# Patient Record
Sex: Male | Born: 1970 | Race: Black or African American | Hispanic: No | Marital: Single | Smoking: Current every day smoker
Health system: Southern US, Community
[De-identification: ages and names within clinical notes are randomized; demographics above are authoritative.]

## PROBLEM LIST (undated history)

## (undated) DIAGNOSIS — J45909 Unspecified asthma, uncomplicated: Secondary | ICD-10-CM

## (undated) HISTORY — PX: PARTIAL HIP ARTHROPLASTY: SHX733

---

## 2004-02-17 ENCOUNTER — Inpatient Hospital Stay: Payer: Self-pay | Admitting: Anesthesiology

## 2004-12-11 ENCOUNTER — Emergency Department: Payer: Self-pay | Admitting: Emergency Medicine

## 2005-02-11 ENCOUNTER — Emergency Department: Payer: Self-pay | Admitting: Emergency Medicine

## 2005-02-12 ENCOUNTER — Emergency Department: Payer: Self-pay | Admitting: Emergency Medicine

## 2005-02-23 ENCOUNTER — Inpatient Hospital Stay: Payer: Self-pay | Admitting: Internal Medicine

## 2005-02-23 ENCOUNTER — Other Ambulatory Visit: Payer: Self-pay

## 2006-05-21 ENCOUNTER — Emergency Department: Payer: Self-pay | Admitting: Emergency Medicine

## 2006-07-16 ENCOUNTER — Emergency Department: Payer: Self-pay | Admitting: Emergency Medicine

## 2007-03-22 ENCOUNTER — Emergency Department: Payer: Self-pay | Admitting: Emergency Medicine

## 2007-05-23 ENCOUNTER — Emergency Department: Payer: Self-pay | Admitting: Emergency Medicine

## 2007-06-03 ENCOUNTER — Emergency Department: Payer: Self-pay | Admitting: Emergency Medicine

## 2007-09-26 ENCOUNTER — Ambulatory Visit: Payer: Self-pay | Admitting: General Practice

## 2007-09-26 ENCOUNTER — Other Ambulatory Visit: Payer: Self-pay

## 2007-10-11 ENCOUNTER — Ambulatory Visit: Payer: Self-pay | Admitting: Family Medicine

## 2007-10-14 ENCOUNTER — Inpatient Hospital Stay: Payer: Self-pay | Admitting: General Practice

## 2008-11-12 ENCOUNTER — Ambulatory Visit: Payer: Self-pay | Admitting: General Practice

## 2008-11-28 ENCOUNTER — Inpatient Hospital Stay: Payer: Self-pay | Admitting: General Practice

## 2009-01-08 IMAGING — CR RIGHT HIP - COMPLETE 2+ VIEW
1 series · 2 of 2 positions shown · non-contrast
Comparison: none

REASON FOR EXAM: postop
COMMENTS:   Bedside (portable):Y

[Series 1: view not recorded · 0.17mm/px · 2 of 2 slices shown]
[im 1/2]
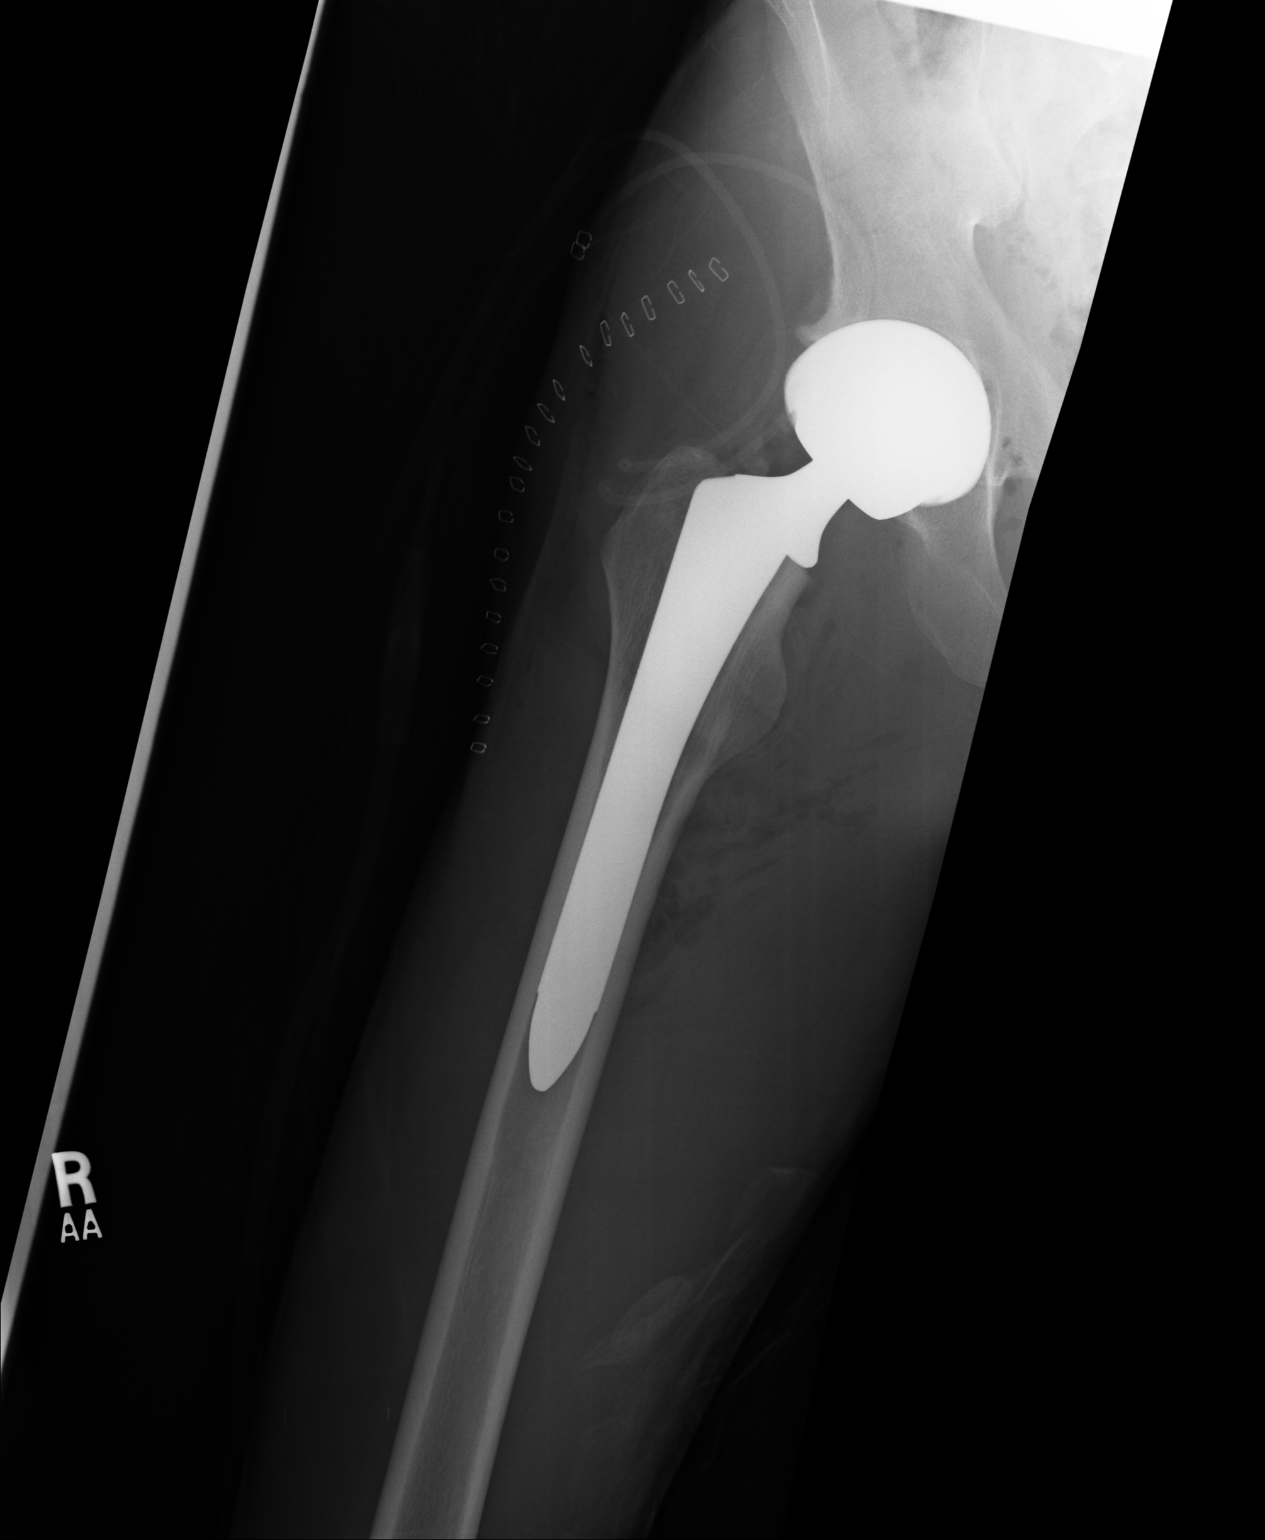
[im 2/2]
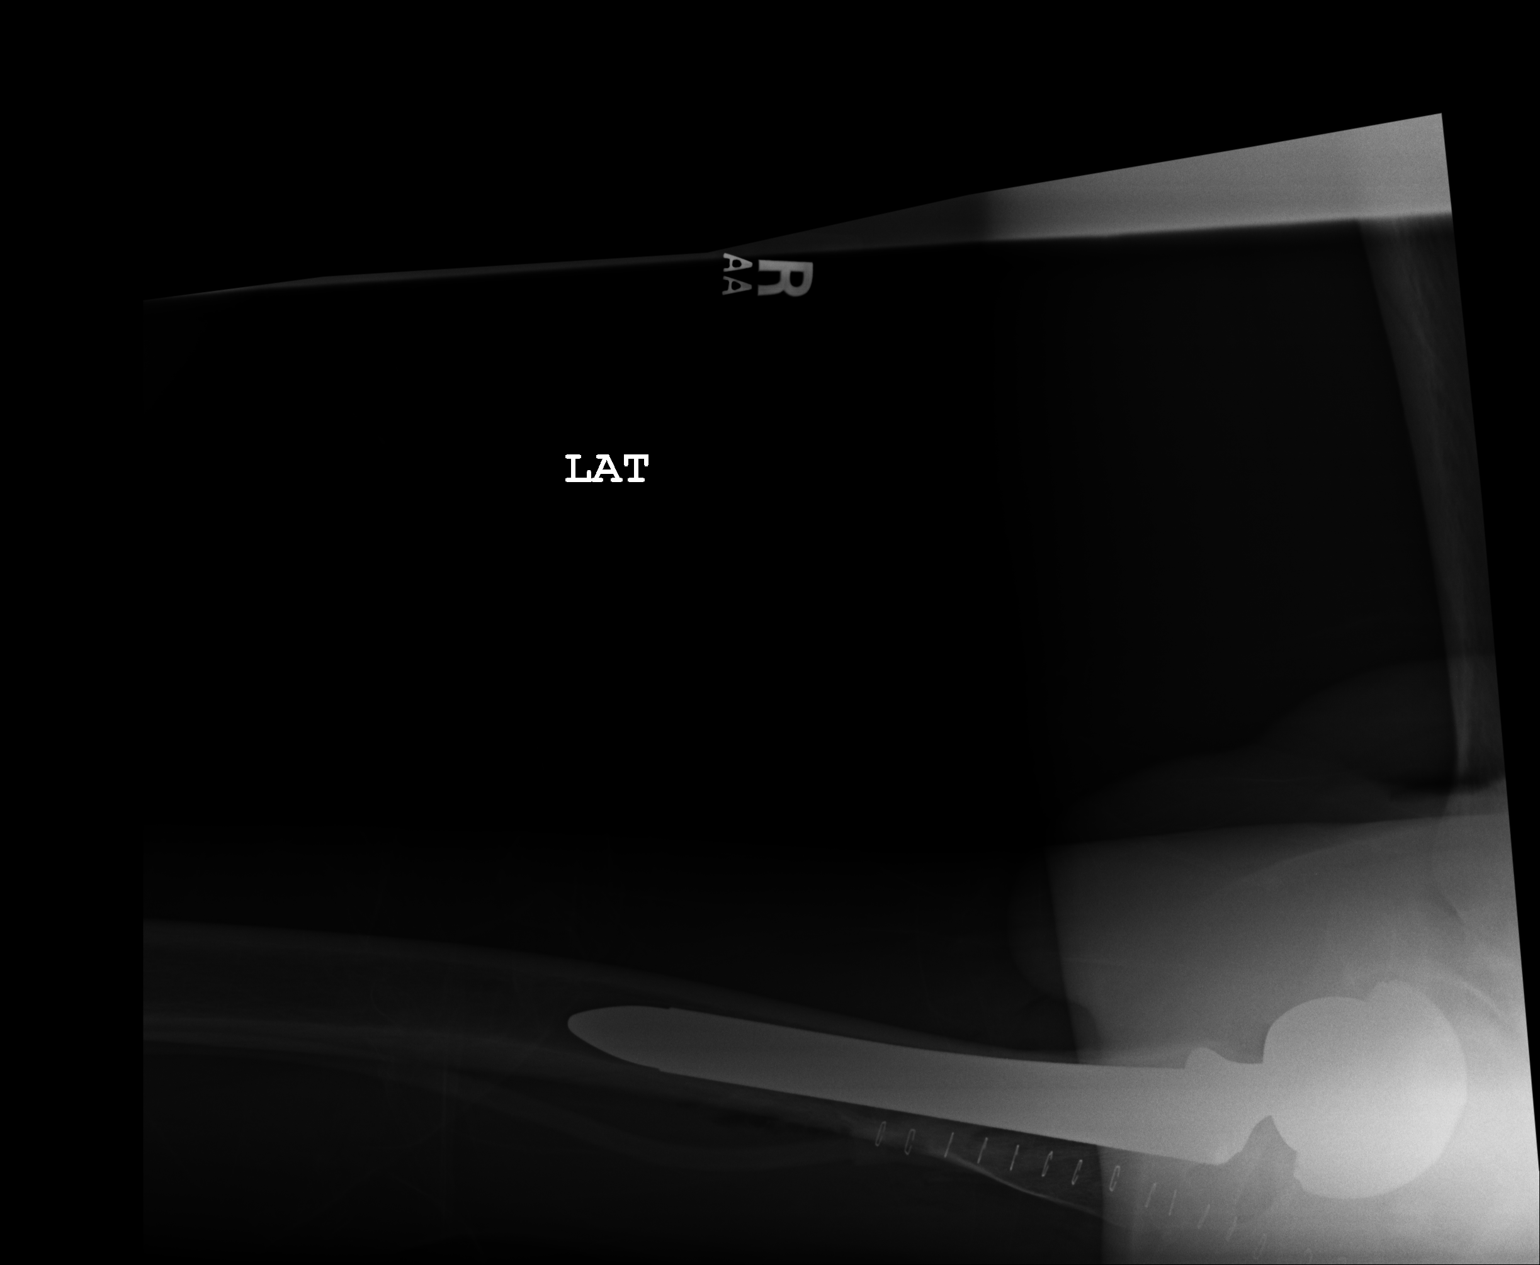

[2 of 2 positions shown; findings below may reference images not displayed]

PROCEDURE:     DXR - DXR HIP RIGHT COMPLETE  - October 14, 2007 [DATE]

RESULT:     Portable AP and crosstable lateral views of the RIGHT hip were
obtained. The patient is status post RIGHT hip replacement. No fracture
about the prosthetic components is seen. There is no dislocation at the
prosthetic hip joint.
IMPRESSION: 1.     The patient is status post RIGHT hip replacement. No abnormal
postoperative changes are identified.

## 2009-01-21 ENCOUNTER — Encounter: Payer: Self-pay | Admitting: General Practice

## 2009-02-18 ENCOUNTER — Encounter: Payer: Self-pay | Admitting: General Practice

## 2009-04-02 ENCOUNTER — Ambulatory Visit: Payer: Self-pay | Admitting: Family Medicine

## 2009-12-19 ENCOUNTER — Emergency Department: Payer: Self-pay | Admitting: Emergency Medicine

## 2013-04-03 ENCOUNTER — Ambulatory Visit: Payer: Self-pay

## 2013-08-25 ENCOUNTER — Inpatient Hospital Stay: Payer: Self-pay | Admitting: Family Medicine

## 2013-08-25 LAB — COMPREHENSIVE METABOLIC PANEL
ALK PHOS: 86 U/L
AST: 21 U/L (ref 15–37)
Albumin: 4.1 g/dL (ref 3.4–5.0)
Anion Gap: 7 (ref 7–16)
BILIRUBIN TOTAL: 0.5 mg/dL (ref 0.2–1.0)
BUN: 14 mg/dL (ref 7–18)
Calcium, Total: 9.3 mg/dL (ref 8.5–10.1)
Chloride: 102 mmol/L (ref 98–107)
Co2: 27 mmol/L (ref 21–32)
Creatinine: 1.07 mg/dL (ref 0.60–1.30)
EGFR (African American): >60
EGFR (Non-African Amer.): >60
Glucose: 97 mg/dL (ref 65–99)
Osmolality: 272 (ref 275–301)
Potassium: 4 mmol/L (ref 3.5–5.1)
SGPT (ALT): 17 U/L (ref 12–78)
Sodium: 136 mmol/L (ref 136–145)
Total Protein: 7.9 g/dL (ref 6.4–8.2)

## 2013-08-25 LAB — CBC
HCT: 44.3 % (ref 40.0–52.0)
HGB: 14.8 g/dL (ref 13.0–18.0)
MCH: 32.4 pg (ref 26.0–34.0)
MCHC: 33.4 g/dL (ref 32.0–36.0)
MCV: 97 fL (ref 80–100)
Platelet: 162 10*3/uL (ref 150–440)
RBC: 4.57 10*6/uL (ref 4.40–5.90)
RDW: 13.6 % (ref 11.5–14.5)
WBC: 9.1 10*3/uL (ref 3.8–10.6)

## 2013-08-25 LAB — TROPONIN I

## 2013-08-26 LAB — BASIC METABOLIC PANEL
Anion Gap: 6 — ABNORMAL LOW (ref 7–16)
BUN: 15 mg/dL (ref 7–18)
CO2: 25 mmol/L (ref 21–32)
Calcium, Total: 9.6 mg/dL (ref 8.5–10.1)
Chloride: 104 mmol/L (ref 98–107)
Creatinine: 1.26 mg/dL (ref 0.60–1.30)
EGFR (Non-African Amer.): >60
Glucose: 128 mg/dL — ABNORMAL HIGH (ref 65–99)
OSMOLALITY: 273 (ref 275–301)
POTASSIUM: 4.4 mmol/L (ref 3.5–5.1)
Sodium: 135 mmol/L — ABNORMAL LOW (ref 136–145)

## 2014-07-20 ENCOUNTER — Ambulatory Visit: Payer: Self-pay

## 2014-08-11 NOTE — H&P (Signed)
PATIENT NAME:  Carlos HeritageURNER, Graden L MR#:  [id number] DATE OF BIRTH:  26-Dec-1970  DATE OF ADMISSION:  08/25/2013  ADDENDUM  This patient told me he does not drink.  He drinks occasional alcohol and does not use any substance abuse.  The nurse just called me and said he drinks two 20 ounce alcoholic beverages a day and does marijuana and cocaine. The patient is on CIWA protocol.   ____________________________ Janyth ContesSital P. Juliene PinaMody, MD spm:ce D: 08/25/2013 19:24:30 ET T: 08/25/2013 20:09:47 ET JOB#: [id number]  cc: Briana Farner P. Juliene PinaMody, MD, <Dictator> Janyth ContesSITAL P Mae Cianci MD ELECTRONICALLY SIGNED 08/25/2013 20:41

## 2014-08-11 NOTE — H&P (Signed)
PATIENT NAME:  Carlos Maddox, Carlos Maddox MR#:  [id number] DATE OF BIRTH:  [birth year]  DATE OF ADMISSION:  08/25/2013  ADDENDUM:  The patient's admission diagnosis should actually be acute respiratory failure secondary to asthma exacerbation and the plan as already outlined.   ____________________________ Norfleet Capers P. Juliene PinaMody, MD spm:lt D: 08/25/2013 17:53:15 ET T: 08/25/2013 19:55:21 ET JOB#: [id number]  cc: Tyasia Packard P. Juliene PinaMody, MD, <Dictator> Janyth ContesSITAL P Shirl Weir MD ELECTRONICALLY SIGNED 08/25/2013 20:41

## 2014-08-11 NOTE — H&P (Signed)
PATIENT NAME:  Carlos Maddox, Carlos Maddox MR#:  [id number] DATE OF BIRTH:  1970-05-01  DATE OF ADMISSION:  08/25/2013  PRIMARY CARE PHYSICIAN:  Darreld Mclean, MD  CHIEF COMPLAINT: Shortness of breath and wheezing.   HISTORY OF PRESENT ILLNESS: A very pleasant 44 year old male with history of asthma and tobacco dependence who presents with the above complaints. The patient says yesterday he is feeling short of breath and had some wheezing. However, today, it was worse. He took his nebulizer. Did not really improve and then he went out to mow his lawn and after that he subsequently developed increasing shortness of breath, wheezing, and was seen in the ER where he received Solu-Medrol, DuoNeb and he remains hypoxic needing oxygen. He does not wear oxygen at home.    REVIEW OF SYSTEMS:  CONSTITUTIONAL: No fever. No fatigue, weakness.  EYES: No blurred vision, glaucoma or cataracts.  ENT: No ear pain, hearing loss, seasonal allergies, snoring, postnasal drip.  RESPIRATORY: Positive cough, nonproductive. Positive wheezing. Positive dyspnea. Positive history of asthma.  CARDIOVASCULAR:  No chest pain, orthopnea, palpitations, syncope, edema, arrhythmia, dyspnea on exertion. GASTROINTESTINAL: No nausea, vomiting, diarrhea, abdominal pain, melena or ulcers.  GENITOURINARY: No dysuria or hematuria. ENDOCRINE:  No polyuria, polydipsia. HEMATOLOGIC:  No anemia or easy bruising. SKIN:  No rash or lesions. MUSCULOSKELETAL: He has limited activity. He had bilateral hip replacements for avascular necrosis.  NEUROLOGIC:  No history of CVA, TIA, seizures. PSYCHIATRIC:  No history of anxiety or depression.  PAST MEDICAL HISTORY: 1.  Asthma. 2.  Tobacco dependence. 3.  Avascular necrosis.  PAST SURGICAL HISTORY:  Bilateral hip replacements.  MEDICATIONS: He only uses ProAir and he has a nebulizer at home.   ALLERGIES: PREDNISONE. HE SAYS THIS IS HIS ALLERGY BECAUSE IT CAUSED AVASCULAR NECROSIS.   FAMILY HISTORY:  Positive for hypertension.   SOCIAL HISTORY: The patient smokes 1 pack a day. He is not interested in quitting. He has occasional alcohol use.   PHYSICAL EXAMINATION:  VITAL SIGNS: Temperature 98.6, pulse 95, respirations 16, blood pressure 115/58, 96% on 2 liters.  GENERAL: The patient is alert, oriented. Initially, he was in moderate distress. Currently, he does not seem to be in any distress.  HEENT: Head is atraumatic. Pupils are round and reactive. Sclerae anicteric. Mucous membranes are moist. Oropharynx is clear. NECK: Supple without JVD, carotid bruits. CARDIOVASCULAR: Regular rate and rhythm. No murmurs, gallops or rubs. PMI is not displaced.  LUNGS: Diffuse wheezing throughout his lung fields. He has fair air movement. No rhonchi are heard. No rales or crackles. Normal chest expansion.  ABDOMEN: Bowel sounds are present. Nontender, nondistended. No hepatosplenomegaly. EXTREMITIES: No clubbing, cyanosis or edema. NEUROLOGIC:  Cranial nerves II through XII are grossly intact.  No focal deficits. SKIN: Without rash or lesions.   BACK: No CVA or vertebral tenderness.   LABORATORIES: White blood cells 9, hemoglobin 14.8, hematocrit 45. Platelets are 162. Sodium 136, potassium 4.0, chloride 102, bicarb 27, BUN 14, creatinine 1.07, glucose 97, calcium 9.3, bilirubin 0.5, alkaline phosphatase 86, ALT 17, AST 21, total protein 7.9, albumin 4.1. Troponin less than 0.02.  Chest x-ray shows no acute cardiopulmonary disease.   ASSESSMENT AND PLAN:  A 44 year old male with a history of tobacco dependence, asthma, who presents with asthma exacerbation.  1.  Asthma exacerbation. The patient is hypoxic on room air, satting about 88% to 90% on room air. He is 92% on 2 liters. He has received treatment in the Emergency Room without much relief. He  continues to have bilateral wheezing and oxygen requirement; therefore, he is admitted to medicine surgical services with IV Solu-Medrol, which the patient  does agree to take given his "ALLERGY" TO PREDNISONE. DuoNeb, Advair and oxygen, which will be weaned as tolerated. I will go ahead and also add Levaquin.  2.  Tobacco dependence. I encouraged the patient to stop smoking. At this time, he is not interested in quitting smoking but he is agreeable for a nicotine patch.  The patient is counseled for 3 minutes.  3.  Alcohol abuse. It does not appear that patient will have any withdrawal. We will continue to monitor. I do not think he needs to be on CIWA at this time.   The patient is a FULL CODE STATUS.  TIME SPENT: Approximately 40 minutes.     ____________________________ Janyth ContesSital P. Juliene PinaMody, MD spm:ce D: 08/25/2013 17:51:08 ET T: 08/25/2013 19:45:28 ET JOB#: [id number]  cc: Jeslyn Amsler P. Juliene PinaMody, MD, <Dictator> Leanna SatoLinda M. Miles, MD Janyth ContesSITAL P Sosaia Pittinger MD ELECTRONICALLY SIGNED 08/25/2013 20:41

## 2014-08-11 NOTE — Discharge Summary (Signed)
PATIENT NAME:  Carlos Maddox, Carlos Maddox MR#:  [id number] DATE OF BIRTH:  1971-02-11  DATE OF ADMISSION:  08/25/2013 DATE OF DISCHARGE:  08/28/2013  REASON FOR ADMISSION:  Shortness of breath and wheezing.   DISCHARGE DIAGNOSES:  1.  Acute asthma exacerbation.  2.  Chronic obstructive pulmonary disease.  3.  Tobacco dependency.  4.  History of avascular necrosis of the hips.  5.  Alcohol abuse.  6.  Acute hypoxic respiratory failure.   DISPOSITION:  Home.   MEDICATIONS AT DISCHARGE:  1.  Albuterol inhale for nebulizer 3 mL every 6 hours as needed for shortness of breath.  2.  Solu-Medrol Dosepak, start with 32 mg 4 mg tablets to take 8 the first day and then decrease by 1 tablet every day until gone.  3.  Levofloxacin 500 mg every 24 hours.  4.  Singular 10 mg once a day.  5.  Albuterol/ipratropium 4 times a day as needed for shortness of breath or wheezing.  6.  Advair 250/50 mcg inhaled twice daily.  7.  Nicotine patches to start on 14 mg once a day for 7 days, then decrease the dose to 7 mg once a day for another 7 days, then to quit smoking.   He is albuterol inhaler was stopped and changed for Combivent.   DISCHARGE DISPOSITION: Home. Follow up with Darreld McleanLinda Miles at [facility] in the next 1 to 2 weeks.   Smoking cessation counseling given to the patient extensively during this hospitalization.   HOSPITAL COURSE: This is nice 44 year old gentleman who has history of asthma, tobacco dependency and avascular necrosis of bilateral hips, comes to the Emergency Department complaining of shortness of breath and wheezing.   The patient was admitted on 08/25/2013. He had, at that moment, a history of several days of feeling short of breath with worsening on the day of admission. The patient started wheezing significantly and was not able to ambulate without getting significantly winded. The patient used his nebulizer several times a day and did not have any significant improvement for what he was  brought to the Emergency Department. In the Emergency Department, he received Solu-Medrol and DuoNebs, and he was found to be hypoxic on room air with oxygen saturation below 90, and on 2 liters of nasal cannula remained around 96. His lowest room air saturation was 88% and over 2 liters remained above 92.   The patient had continued treatment for this asthma exacerbation during this hospitalization with IV Solu-Medrol and he was started on DuoNebs. Apparently, the patient was ALLERGIC TO PREDNISONE, but he tolerated Solu-Medrol very well. We are not quite sure what the allergic reaction was, although seems more like a side effect. Since the patient was not willing to take prednisone, he was discharged on Medrol Dosepak. The patient actually did well during this hospitalization. He states that he is going to try his best to quit smoking, although he feels like the nicotine patches are really extensive, even though making the numbers, it shows that they are less expensive than buying a pack of cigarettes.   As far as his alcohol dependency, he was on CIWA protocol. He did not have any signs of DTs. The patient was able to be removed from supplemental oxygen within the first 24 hours, and had significant improvement during the next couple of days.   The patient continued to wheeze through the whole hospitalization and had some significant rhonchi, although I think this is more of his baseline, because he  was ambulating, feeling fine  with minimal cough.   The patient was educated about the need of continuing his inhalers at home and completed the course of antibiotics and steroids. The patient did not have any infiltrates on his x-ray.  No signs of pneumonia, but it felt like he might benefit from an antibiotic for acute bronchitis.   IMPORTANT IMAGING AND LABORATORY DATA: Portable chest x-ray showed  findings consistent with chronic reactive airway disease without any acute abnormalities, only  hyperinflation and mild bronchial wall thickening.   The patient's white count was 9.1 on admission. His creatinine was 1.07, sodium 136, glucose 128 by discharge.   I spent about 40 minutes discharging this patient.    ____________________________ Felipa Furnace, MD rsg:dmm D: 08/29/2013 06:51:10 ET T: 08/29/2013 09:33:33 ET JOB#: [id number]  cc: Felipa Furnace, MD, <Dictator> Leanna Sato, MD Pearletha Furl MD ELECTRONICALLY SIGNED 09/08/2013 22:10

## 2014-08-28 ENCOUNTER — Encounter: Payer: Self-pay | Admitting: *Deleted

## 2014-08-28 ENCOUNTER — Emergency Department
Admission: EM | Admit: 2014-08-28 | Discharge: 2014-08-28 | Disposition: A | Discharge status: Home / Self Care | Payer: Medicaid Other | Attending: Emergency Medicine | Admitting: Emergency Medicine

## 2014-08-28 DIAGNOSIS — Y998 Other external cause status: Secondary | ICD-10-CM | POA: Insufficient documentation

## 2014-08-28 DIAGNOSIS — Z720 Tobacco use: Secondary | ICD-10-CM | POA: Diagnosis not present

## 2014-08-28 DIAGNOSIS — Y9389 Activity, other specified: Secondary | ICD-10-CM | POA: Insufficient documentation

## 2014-08-28 DIAGNOSIS — IMO0002 Reserved for concepts with insufficient information to code with codable children: Secondary | ICD-10-CM

## 2014-08-28 DIAGNOSIS — Z23 Encounter for immunization: Secondary | ICD-10-CM | POA: Insufficient documentation

## 2014-08-28 DIAGNOSIS — S0181XA Laceration without foreign body of other part of head, initial encounter: Secondary | ICD-10-CM | POA: Insufficient documentation

## 2014-08-28 DIAGNOSIS — Y9289 Other specified places as the place of occurrence of the external cause: Secondary | ICD-10-CM | POA: Diagnosis not present

## 2014-08-28 DIAGNOSIS — S0990XA Unspecified injury of head, initial encounter: Secondary | ICD-10-CM | POA: Diagnosis not present

## 2014-08-28 DIAGNOSIS — S0993XA Unspecified injury of face, initial encounter: Secondary | ICD-10-CM | POA: Diagnosis present

## 2014-08-28 HISTORY — DX: Unspecified asthma, uncomplicated: J45.909

## 2014-08-28 MED ORDER — TETANUS-DIPHTH-ACELL PERTUSSIS 5-2.5-18.5 LF-MCG/0.5 IM SUSP
0.5000 mL | Freq: Once | INTRAMUSCULAR | Status: AC
  Administered 2014-08-28: 0.5 mL via INTRAMUSCULAR

## 2014-08-28 MED ORDER — IBUPROFEN 800 MG PO TABS: 800.0000 mg | ORAL_TABLET | Freq: Three times a day (TID) | ORAL | Status: DC | PRN

## 2014-08-28 MED ORDER — LIDOCAINE-EPINEPHRINE (PF) 1 %-1:200000 IJ SOLN
10.0000 mL | Freq: Once | INTRAMUSCULAR | Status: AC
  Administered 2014-08-28: 10 mL

## 2014-08-28 MED ORDER — TETANUS-DIPHTH-ACELL PERTUSSIS 5-2.5-18.5 LF-MCG/0.5 IM SUSP
INTRAMUSCULAR | Status: AC
  Filled 2014-08-28: qty 0.5

## 2014-08-28 MED ORDER — LIDOCAINE-EPINEPHRINE (PF) 1 %-1:200000 IJ SOLN
INTRAMUSCULAR | Status: AC
  Filled 2014-08-28: qty 30
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
## 2014-08-28 NOTE — Discharge Instructions (Signed)
Please seek medical attention for any high fevers, chest pain, shortness of breath, change in behavior, persistent vomiting, bloody stool or any other new or concerning symptoms. You have been stitched with absorbable sutures, they will dissolve on their own and do not need to be taken out.  Laceration Care, Adult A laceration is a cut that goes through all layers of the skin. The cut goes into the tissue beneath the skin. HOME CARE For stitches (sutures) or staples:  Keep the cut clean and dry.  If you have a bandage (dressing), change it at least once a day. Change the bandage if it gets wet or dirty, or as told by your doctor.  Wash the cut with soap and water 2 times a day. Rinse the cut with water. Pat it dry with a clean towel.  Put a thin layer of medicated cream on the cut as told by your doctor.  You may shower after the first 24 hours. Do not soak the cut in water until the stitches are removed.  Only take medicines as told by your doctor.  Have your stitches or staples removed as told by your doctor. For skin adhesive strips:  Keep the cut clean and dry.  Do not get the strips wet. You may take a bath, but be careful to keep the cut dry.  If the cut gets wet, pat it dry with a clean towel.  The strips will fall off on their own. Do not remove the strips that are still stuck to the cut. For wound glue:  You may shower or take baths. Do not soak or scrub the cut. Do not swim. Avoid heavy sweating until the glue falls off on its own. After a shower or bath, pat the cut dry with a clean towel.  Do not put medicine on your cut until the glue falls off.  If you have a bandage, do not put tape over the glue.  Avoid lots of sunlight or tanning lamps until the glue falls off. Put sunscreen on the cut for the first year to reduce your scar.  The glue will fall off on its own. Do not pick at the glue. You may need a tetanus shot if:  You cannot remember when you had your  last tetanus shot.  You have never had a tetanus shot. If you need a tetanus shot and you choose not to have one, you may get tetanus. Sickness from tetanus can be serious. GET HELP RIGHT AWAY IF:   Your pain does not get better with medicine.  Your arm, hand, leg, or foot loses feeling (numbness) or changes color.  Your cut is bleeding.  Your joint feels weak, or you cannot use your joint.  You have painful lumps on your body.  Your cut is red, puffy (swollen), or painful.  You have a red line on the skin near the cut.  You have yellowish-white fluid (pus) coming from the cut.  You have a fever.  You have a bad smell coming from the cut or bandage.  Your cut breaks open before or after stitches are removed.  You notice something coming out of the cut, such as wood or glass.  You cannot move a finger or toe. MAKE SURE YOU:   Understand these instructions.  Will watch your condition.  Will get help right away if you are not doing well or get worse. Document Released: 09/23/2007 Document Revised: 06/29/2011 Document Reviewed: 09/30/2010 Exit[employer] Patient Information 2015 [employer]. This information is not intended to replace advice given to you by your health care provider. Make sure you discuss any questions you have with your health care provider.

## 2016-04-04 ENCOUNTER — Emergency Department: Payer: Medicaid Other

## 2016-04-04 ENCOUNTER — Encounter: Payer: Self-pay | Admitting: Emergency Medicine

## 2016-04-04 ENCOUNTER — Emergency Department
Admission: EM | Admit: 2016-04-04 | Discharge: 2016-04-04 | Disposition: A | Discharge status: Home / Self Care | Payer: Medicaid Other | Attending: Emergency Medicine | Admitting: Emergency Medicine

## 2016-04-04 DIAGNOSIS — Z87891 Personal history of nicotine dependence: Secondary | ICD-10-CM | POA: Insufficient documentation

## 2016-04-04 DIAGNOSIS — J4521 Mild intermittent asthma with (acute) exacerbation: Secondary | ICD-10-CM

## 2016-04-04 DIAGNOSIS — Z791 Long term (current) use of non-steroidal anti-inflammatories (NSAID): Secondary | ICD-10-CM | POA: Insufficient documentation

## 2016-04-04 DIAGNOSIS — R0602 Shortness of breath: Secondary | ICD-10-CM | POA: Diagnosis present

## 2016-04-04 LAB — CBC
HCT: 42.6 % (ref 40.0–52.0)
HEMOGLOBIN: 14.7 g/dL (ref 13.0–18.0)
MCH: 33 pg (ref 26.0–34.0)
MCHC: 34.6 g/dL (ref 32.0–36.0)
MCV: 95.3 fL (ref 80.0–100.0)
PLATELETS: 185 10*3/uL (ref 150–440)
RBC: 4.46 MIL/uL (ref 4.40–5.90)
RDW: 13.8 % (ref 11.5–14.5)
WBC: 6.4 10*3/uL (ref 3.8–10.6)

## 2016-04-04 LAB — BASIC METABOLIC PANEL
ANION GAP: 7 (ref 5–15)
BUN: 15 mg/dL (ref 6–20)
CALCIUM: 9.1 mg/dL (ref 8.9–10.3)
CO2: 28 mmol/L (ref 22–32)
CREATININE: 1.09 mg/dL (ref 0.61–1.24)
Chloride: 104 mmol/L (ref 101–111)
Glucose, Bld: 88 mg/dL (ref 65–99)
Potassium: 3.8 mmol/L (ref 3.5–5.1)
SODIUM: 139 mmol/L (ref 135–145)

## 2016-04-04 LAB — TROPONIN I

## 2016-04-04 MED ORDER — DEXAMETHASONE SODIUM PHOSPHATE 10 MG/ML IJ SOLN
10.0000 mg | Freq: Once | INTRAMUSCULAR | Status: AC
  Administered 2016-04-04: 10 mg via INTRAMUSCULAR
  Filled 2016-04-04: qty 1

## 2016-04-04 MED ORDER — ALBUTEROL SULFATE HFA 108 (90 BASE) MCG/ACT IN AERS: 2.0000 | INHALATION_SPRAY | RESPIRATORY_TRACT | 0 refills | Status: DC | PRN

## 2016-04-04 MED ORDER — IPRATROPIUM-ALBUTEROL 0.5-2.5 (3) MG/3ML IN SOLN
3.0000 mL | Freq: Once | RESPIRATORY_TRACT | Status: AC
  Administered 2016-04-04: 3 mL via RESPIRATORY_TRACT
  Filled 2016-04-04: qty 3

## 2016-04-04 MED ORDER — ALBUTEROL SULFATE (2.5 MG/3ML) 0.083% IN NEBU
INHALATION_SOLUTION | RESPIRATORY_TRACT | Status: AC
  Filled 2016-04-04: qty 3

## 2016-04-04 MED ORDER — ALBUTEROL SULFATE (2.5 MG/3ML) 0.083% IN NEBU
5.0000 mg | INHALATION_SOLUTION | Freq: Once | RESPIRATORY_TRACT | Status: AC
  Administered 2016-04-04: 5 mg via RESPIRATORY_TRACT

## 2016-04-04 NOTE — ED Notes (Signed)
Pt states he is here for his asthma, states been having attacks x 2 days. States out of his inhaler. Pt is in no apparent distress, lying on the stretcher, breathing is non labored. Pt speaking in complete sentences, alert and oriented x 4.

## 2016-04-04 NOTE — ED Triage Notes (Signed)
Pt ambulatory to triage, report hx asthma and SOB since Tuesday, pt reports taking inhaler w/o relief.  Pt NAD at this time

## 2016-04-04 NOTE — ED Notes (Signed)
Pt. Going home with mother.  Pt. Given work excuse for 12/16.

## 2016-04-04 NOTE — ED Provider Notes (Signed)
[facility] Emergency Department Provider Note   ____________________________________________   First MD Initiated Contact with Patient 04/04/16 [id number]     (approximate)  I have reviewed the triage vital signs and the nursing notes.   HISTORY  Chief Complaint Shortness of Breath    HPI Carlos Maddox is a 45 y.o. male who presents to the ED from home with a chief complaint of asthma exacerbation. Patient reports a 3 day history of wheezing and shortness of breath. Reports taking his inhaler without relief of symptoms. Denies associated fever, chills, chest pain, cough, abdominal pain, nausea, vomiting, diarrhea. Denies recent trauma or travel. Nothing makes his symptoms better or worse.   Past Medical History:  Diagnosis Date  . Asthma     There are no active problems to display for this patient.   Past Surgical History:  Procedure Laterality Date  . PARTIAL HIP ARTHROPLASTY      Prior to Admission medications   Medication Sig Start Date End Date Taking? Authorizing Provider  albuterol (PROVENTIL HFA;VENTOLIN HFA) 108 (90 Base) MCG/ACT inhaler Inhale 2 puffs into the lungs every 4 (four) hours as needed for wheezing or shortness of breath. 04/04/16   Irean HongJade J Finlay Godbee, MD  ibuprofen (ADVIL,MOTRIN) 800 MG tablet Take 1 tablet (800 mg total) by mouth every 8 (eight) hours as needed. 08/28/14   Phineas SemenGraydon Goodman, MD    Allergies Patient has no known allergies.  History reviewed. No pertinent family history.  Social History Social History  Substance Use Topics  . Smoking status: Former Games developermoker  . Smokeless tobacco: Never Used  . Alcohol use Yes    Review of Systems  Constitutional: No fever/chills. Eyes: No visual changes. ENT: No sore throat. Cardiovascular: Denies chest pain. Respiratory: Positive for wheezing and shortness of breath. Gastrointestinal: No abdominal pain.  No nausea, no vomiting.  No diarrhea.  No constipation. Genitourinary:  Negative for dysuria. Musculoskeletal: Negative for back pain. Skin: Negative for rash. Neurological: Negative for headaches, focal weakness or numbness.  10-point ROS otherwise negative.  ____________________________________________   PHYSICAL EXAM:  VITAL SIGNS: ED Triage Vitals  Enc Vitals Group     BP 04/04/16 0023 110/74     Pulse Rate 04/04/16 0023 86     Resp 04/04/16 0023 16     Temp 04/04/16 0023 97.6 F (36.4 C)     Temp Source 04/04/16 0023 Oral     SpO2 04/04/16 0023 92 %     Weight 04/04/16 0021 152 lb (68.9 kg)     Height 04/04/16 0021 6' (1.829 m)     Head Circumference --      Peak Flow --      Pain Score 04/04/16 0021 9     Pain Loc --      Pain Edu? --      Excl. in GC? --     Constitutional: Asleep, awakened for exam. Alert and oriented. Well appearing and in no acute distress. Eyes: Conjunctivae are normal. PERRL. EOMI. Head: Atraumatic. Nose: No congestion/rhinnorhea. Mouth/Throat: Mucous membranes are moist.  Oropharynx non-erythematous. Neck: No stridor.   Cardiovascular: Normal rate, regular rhythm. Grossly normal heart sounds.  Good peripheral circulation. Respiratory: Normal respiratory effort.  No retractions. Lungs with scattered wheezing. Gastrointestinal: Soft and nontender. No distention. No abdominal bruits. No CVA tenderness. Musculoskeletal: No lower extremity tenderness nor edema.  No joint effusions. Neurologic:  Normal speech and language. No gross focal neurologic deficits are appreciated. No gait instability. Skin:  Skin is warm, dry and intact. No rash noted. Psychiatric: Mood and affect are normal. Speech and behavior are normal.  ____________________________________________   LABS (all labs ordered are listed, but only abnormal results are displayed)  Labs Reviewed  BASIC METABOLIC PANEL  CBC  TROPONIN I   ____________________________________________  EKG  ED ECG REPORT I, Dan Scearce J, the attending physician,  personally viewed and interpreted this ECG.   Date: 04/04/2016  EKG Time: 0031  Rate: 73  Rhythm: normal EKG, normal sinus rhythm  Axis: Normal  Intervals:none  ST&T Change: Nonspecific  ____________________________________________  RADIOLOGY  Chest 2 view (reviewed by me, interpreted per Dr.Radparvar): No active cardiopulmonary disease. ____________________________________________   PROCEDURES  Procedure(s) performed: None  Procedures  Critical Care performed: No  ____________________________________________   INITIAL IMPRESSION / ASSESSMENT AND PLAN / ED COURSE  Pertinent labs & imaging results that were available during my care of the patient were reviewed by me and considered in my medical decision making (see chart for details).  45 year old male who presents with asthma exacerbation. 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 I explained that a short burst of prednisone should not affect his hip. After discussion, he agrees to IM Decadron. Will administer DuoNeb and reassess.  Clinical Course as of Apr 04 356  Sat Apr 04, 2016  0356 Improved after DuoNeb. Will refill albuterol inhaler. Strict return precautions given. Patient verbalizes understanding and agrees with plan of care.  [JS]    Clinical Course User Index [JS] Irean HongJade J Benny Henrie, MD     ____________________________________________   FINAL CLINICAL IMPRESSION(S) / ED DIAGNOSES  Final diagnoses:  Mild intermittent asthma with exacerbation      NEW MEDICATIONS STARTED DURING THIS VISIT:  New Prescriptions   ALBUTEROL (PROVENTIL HFA;VENTOLIN HFA) 108 (90 BASE) MCG/ACT INHALER    Inhale 2 puffs into the lungs every 4 (four) hours as needed for wheezing or shortness of breath.     Note:  This document was prepared using Dragon voice recognition software and may include unintentional dictation errors.    Irean HongJade J Ernestyne Caldwell, MD 04/04/16 [phone]

## 2016-04-04 NOTE — Discharge Instructions (Signed)
1. Use albuterol inhaler 2 puffs every 4 hours as needed for wheezing. 2. Return to the ER for worsening symptoms, persistent vomiting, difficulty breathing or other concerns.

## 2016-04-09 ENCOUNTER — Encounter: Payer: Self-pay | Admitting: Emergency Medicine

## 2016-04-09 ENCOUNTER — Emergency Department
Admission: EM | Admit: 2016-04-09 | Discharge: 2016-04-09 | Disposition: A | Discharge status: Home / Self Care | Payer: Medicaid Other | Attending: Emergency Medicine | Admitting: Emergency Medicine

## 2016-04-09 ENCOUNTER — Emergency Department: Payer: Medicaid Other

## 2016-04-09 DIAGNOSIS — F17210 Nicotine dependence, cigarettes, uncomplicated: Secondary | ICD-10-CM | POA: Insufficient documentation

## 2016-04-09 DIAGNOSIS — R0602 Shortness of breath: Secondary | ICD-10-CM | POA: Diagnosis present

## 2016-04-09 DIAGNOSIS — Z79899 Other long term (current) drug therapy: Secondary | ICD-10-CM | POA: Diagnosis not present

## 2016-04-09 DIAGNOSIS — J45901 Unspecified asthma with (acute) exacerbation: Secondary | ICD-10-CM | POA: Diagnosis not present

## 2016-04-09 LAB — CBC WITH DIFFERENTIAL/PLATELET
Basophils Absolute: 0 10*3/uL (ref 0–0.1)
Basophils Relative: 1 %
Eosinophils Absolute: 0.1 10*3/uL (ref 0–0.7)
Eosinophils Relative: 3 %
HEMATOCRIT: 41.6 % (ref 40.0–52.0)
HEMOGLOBIN: 14.1 g/dL (ref 13.0–18.0)
LYMPHS ABS: 0.8 10*3/uL — AB (ref 1.0–3.6)
Lymphocytes Relative: 17 %
MCH: 32.7 pg (ref 26.0–34.0)
MCHC: 33.7 g/dL (ref 32.0–36.0)
MCV: 97 fL (ref 80.0–100.0)
MONOS PCT: 3 %
Monocytes Absolute: 0.2 10*3/uL (ref 0.2–1.0)
NEUTROS ABS: 3.7 10*3/uL (ref 1.4–6.5)
NEUTROS PCT: 76 %
Platelets: 166 10*3/uL (ref 150–440)
RBC: 4.3 MIL/uL — ABNORMAL LOW (ref 4.40–5.90)
RDW: 14.1 % (ref 11.5–14.5)
WBC: 4.8 10*3/uL (ref 3.8–10.6)

## 2016-04-09 LAB — TROPONIN I

## 2016-04-09 LAB — BASIC METABOLIC PANEL
Anion gap: 6 (ref 5–15)
BUN: 13 mg/dL (ref 6–20)
CHLORIDE: 106 mmol/L (ref 101–111)
CO2: 25 mmol/L (ref 22–32)
CREATININE: 1.06 mg/dL (ref 0.61–1.24)
Calcium: 8.9 mg/dL (ref 8.9–10.3)
GFR calc non Af Amer: >60 mL/min (ref >60)
Glucose, Bld: 109 mg/dL — ABNORMAL HIGH (ref 65–99)
POTASSIUM: 4.5 mmol/L (ref 3.5–5.1)
Sodium: 137 mmol/L (ref 135–145)

## 2016-04-09 LAB — INFLUENZA PANEL BY PCR (TYPE A & B)
Influenza A By PCR: NEGATIVE
Influenza B By PCR: NEGATIVE

## 2016-04-09 MED ORDER — PREDNISONE 20 MG PO TABS: 60.0000 mg | ORAL_TABLET | Freq: Every day | ORAL | 0 refills | Status: AC

## 2016-04-09 MED ORDER — PREDNISONE 20 MG PO TABS
ORAL_TABLET | ORAL | Status: AC
  Administered 2016-04-09: 60 mg via ORAL
  Filled 2016-04-09: qty 3

## 2016-04-09 MED ORDER — IPRATROPIUM-ALBUTEROL 0.5-2.5 (3) MG/3ML IN SOLN
RESPIRATORY_TRACT | Status: AC
  Administered 2016-04-09: 3 mL via RESPIRATORY_TRACT
  Filled 2016-04-09: qty 9

## 2016-04-09 MED ORDER — ALBUTEROL SULFATE (2.5 MG/3ML) 0.083% IN NEBU
10.0000 mg/h | INHALATION_SOLUTION | Freq: Once | RESPIRATORY_TRACT | Status: AC
  Administered 2016-04-09: 10 mg/h via RESPIRATORY_TRACT

## 2016-04-09 MED ORDER — IPRATROPIUM-ALBUTEROL 0.5-2.5 (3) MG/3ML IN SOLN
3.0000 mL | Freq: Once | RESPIRATORY_TRACT | Status: AC
  Administered 2016-04-09: 3 mL via RESPIRATORY_TRACT

## 2016-04-09 MED ORDER — FLUTICASONE-SALMETEROL 100-50 MCG/DOSE IN AEPB: 1.0000 | INHALATION_SPRAY | Freq: Two times a day (BID) | RESPIRATORY_TRACT | 1 refills | Status: DC

## 2016-04-09 MED ORDER — PREDNISONE 20 MG PO TABS
60.0000 mg | ORAL_TABLET | Freq: Once | ORAL | Status: AC
  Administered 2016-04-09: 60 mg via ORAL

## 2016-04-09 NOTE — ED Provider Notes (Signed)
[facility] Emergency Department Provider Note  ____________________________________________  Time seen: Approximately 7:50 AM  I have reviewed the triage vital signs and the nursing notes.   HISTORY  Chief Complaint Asthma   HPI Carlos Maddox is a 45 y.o. male with a history of asthma who presents for evaluation of wheezing and shortness of breath. Patient reports one week of the cough and congestion. He was seen in the emergency room 5 days ago for a mild asthma exacerbation. He continues to use his rescue inhaler however those are not helping anymore. Patient reports he had a prior hospitalization requiring intubation and is not on any daily medications for his asthma. He continues to smoke. Has not received a flu shot this season. He denies fever or chills, nausea or vomiting. He endorses severe shortness of breath when he arrived to the emergency room rating it as 9 out of 10 and after receiving 3 DuoNeb prior to my evaluation he reports that his shortness of breath was not 7 out of 10. He denies chest pain.  Past Medical History:  Diagnosis Date  . Asthma     There are no active problems to display for this patient.   Past Surgical History:  Procedure Laterality Date  . PARTIAL HIP ARTHROPLASTY      Prior to Admission medications   Medication Sig Start Date End Date Taking? Authorizing Provider  albuterol (PROVENTIL HFA;VENTOLIN HFA) 108 (90 Base) MCG/ACT inhaler Inhale 2 puffs into the lungs every 4 (four) hours as needed for wheezing or shortness of breath. 04/04/16  Yes Irean Hong, MD  albuterol (PROVENTIL) (2.5 MG/3ML) 0.083% nebulizer solution Take 2.5 mg by nebulization every 6 (six) hours as needed for wheezing or shortness of breath.   Yes Historical Provider, MD  Fluticasone-Salmeterol (ADVAIR DISKUS) 100-50 MCG/DOSE AEPB Inhale 1 puff into the lungs 2 (two) times daily. 04/09/16 04/09/17  Nita Sickle, MD  ibuprofen (ADVIL,MOTRIN)  800 MG tablet Take 1 tablet (800 mg total) by mouth every 8 (eight) hours as needed. Patient not taking: Reported on 04/09/2016 08/28/14   Phineas Semen, MD  predniSONE (DELTASONE) 20 MG tablet Take 3 tablets (60 mg total) by mouth daily. 04/09/16 04/13/16  Nita Sickle, MD    Allergies Patient has no known allergies.  History reviewed. No pertinent family history.  Social History Social History  Substance Use Topics  . Smoking status: Current Every Day Smoker    Types: Cigarettes  . Smokeless tobacco: Never Used  . Alcohol use Yes    Review of Systems  Constitutional: Negative for fever. Eyes: Negative for visual changes. ENT: Negative for sore throat. Neck: No neck pain  Cardiovascular: Negative for chest pain. Respiratory: + shortness of breath, wheezing, and cough Gastrointestinal: Negative for abdominal pain, vomiting or diarrhea. Genitourinary: Negative for dysuria. Musculoskeletal: Negative for back pain. Skin: Negative for rash. Neurological: Negative for headaches, weakness or numbness. Psych: No SI or HI  ____________________________________________   PHYSICAL EXAM:  VITAL SIGNS: ED Triage Vitals  Enc Vitals Group     BP 04/09/16 0449 114/62     Pulse Rate 04/09/16 0449 85     Resp 04/09/16 0449 (!) 28     Temp 04/09/16 0449 97.6 F (36.4 C)     Temp Source 04/09/16 0449 Oral     SpO2 04/09/16 0449 93 %     Weight 04/09/16 0450 152 lb (68.9 kg)     Height 04/09/16 0450 6' (1.829 m)  Head Circumference --      Peak Flow --      Pain Score 04/09/16 0450 10     Pain Loc --      Pain Edu? --      Excl. in GC? --     Constitutional: Alert and oriented. Well appearing and in no apparent distress. HEENT:      Head: Normocephalic and atraumatic.         Eyes: Conjunctivae are normal. Sclera is non-icteric. EOMI. PERRL      Mouth/Throat: Mucous membranes are moist.       Neck: Supple with no signs of meningismus. Cardiovascular: Regular rate  and rhythm. No murmurs, gallops, or rubs. 2+ symmetrical distal pulses are present in all extremities. No JVD. Respiratory: Normal respiratory effort. Speaking in full sentences, decreased air movement bilaterally with diffuse expiratory wheezing. Gastrointestinal: Soft, non tender, and non distended with positive bowel sounds. No rebound or guarding. Genitourinary: No CVA tenderness. Musculoskeletal: Nontender with normal range of motion in all extremities. No edema, cyanosis, or erythema of extremities. Neurologic: Normal speech and language. Face is symmetric. Moving all extremities. No gross focal neurologic deficits are appreciated. Skin: Skin is warm, dry and intact. No rash noted. Psychiatric: Mood and affect are normal. Speech and behavior are normal.  ____________________________________________   LABS (all labs ordered are listed, but only abnormal results are displayed)  Labs Reviewed  CBC WITH DIFFERENTIAL/PLATELET - Abnormal; Notable for the following:       Result Value   RBC 4.30 (*)    Lymphs Abs 0.8 (*)    All other components within normal limits  BASIC METABOLIC PANEL - Abnormal; Notable for the following:    Glucose, Bld 109 (*)    All other components within normal limits  INFLUENZA PANEL BY PCR (TYPE A & B, H1N1)  TROPONIN I   ____________________________________________  EKG  ED ECG REPORT I, Nita Sickle, the attending physician, personally viewed and interpreted this ECG.   Normal sinus rhythm, rate of 70, normal intervals, normal axis, no ST elevations or depressions, T-wave inversions in lead 3 which is new from prior.  ____________________________________________  RADIOLOGY  CXR:  Negative ____________________________________________   PROCEDURES  Procedure(s) performed: None Procedures Critical Care performed:  None ____________________________________________   INITIAL IMPRESSION / ASSESSMENT AND PLAN / ED COURSE  45 y.o. male  with a history of asthma who presents for evaluation of wheezing and shortness of breath. I evaluated patient after he had received 3 DuoNeb treatments in the emergency department and a dose of prednisone. Patient shortness of breath improved somewhat however he was still tachypneic and with diffuse expiratory wheezes, sats normal and speaking in full sentences. /hr albuterol ordered. Labs, flu swab and CXR ordered. Will reassess after 1 hour.  Clinical Course as of Apr 10 851  Thu Apr 09, 2016  1610 Patient is now clear, moving good air, normal WOB, no wheezes and satting 100%. Will dc home on prednisone  x 4 days, albuterol and will start patient on Advair. Flu negative, CXR with no evidence of PNA.  [CV]    Clinical Course User Index [CV] Nita Sickle, MD    Pertinent labs & imaging results that were available during my care of the patient were reviewed by me and considered in my medical decision making (see chart for details).    ____________________________________________   FINAL CLINICAL IMPRESSION(S) / ED DIAGNOSES  Final diagnoses:  Moderate asthma with exacerbation, unspecified whether persistent  NEW MEDICATIONS STARTED DURING THIS VISIT:  New Prescriptions   FLUTICASONE-SALMETEROL (ADVAIR DISKUS) 100-50 MCG/DOSE AEPB    Inhale 1 puff into the lungs 2 (two) times daily.   PREDNISONE (DELTASONE) 20 MG TABLET    Take 3 tablets (60 mg total) by mouth daily.     Note:  This document was prepared using Dragon voice recognition software and may include unintentional dictation errors.    Nita Sicklearolina Eitan Doubleday, MD 04/09/16 [phone]

## 2016-04-09 NOTE — Discharge Instructions (Signed)
You were seen in the Emergency Department (ED) today and was diagnosed with an asthma exacerbation.   During an asthma attack, the airways swell and narrow. This makes it hard to breathe. Severe asthma attacks can be life-threatening, but you can help prevent them by keeping your asthma under control and treating symptoms before they get bad. This can also help you avoid future trips to the emergency room.   Follow-up with your doctor in 1 day for further evaluation.  Use albuterol 2 puffs every 4 hours as needed for shortness of breath, wheezing, or cough. Take steroids as prescribed.   Use Advair 2 puffs twice a day everyday.  When should you call for help?  Call 911 anytime you think you may need emergency care. For example, call if:  You have severe trouble breathing. Call your doctor now or seek immediate medical care if:  Your symptoms do not get better after you have followed your asthma action plan.  You have new or worse trouble breathing.  Your coughing and wheezing get worse.  You cough up dark brown or bloody mucus (sputum).  You have a new or higher fever. Watch closely for changes in your health, and be sure to contact your doctor if:  You need to use quick-relief medicine on more than 2 days a week (unless it is just for exercise).  You cough more deeply or more often, especially if you notice more mucus or a change in the color of your mucus.  You are not getting better as expected.  How can you care for yourself at home?  Follow your asthma action plan to prevent and treat attacks. If you don't have an asthma action plan, work with your doctor to create one.  Take your asthma medicines exactly as prescribed. Talk to your doctor right away if you have any questions about how to take them.  Use your quick-relief medicine when you have symptoms of an attack. Quick-relief medicine is usually an albuterol inhaler. Some people need to use quick-relief medicine before they  exercise.  Take your controller medicine every day, not just when you have symptoms. Controller medicine is usually an inhaled corticosteroid. The goal is to prevent problems before they occur. Don't use your controller medicine to treat an attack that has already started. It doesn't work fast enough to help.  If your doctor prescribed corticosteroid pills to use during an attack, take them exactly as prescribed. It may take hours for the pills to work, but they may make the episode shorter and help you breathe better.  Keep your quick-relief medicine with you at all times. Talk to your doctor before using other medicines. Some medicines, such as aspirin, can cause asthma attacks in some people.  Learn how to use a peak flow meter to check how well you are breathing. This can help you predict when an asthma attack is going to occur. Then you can take medicine to prevent the asthma attack or make it less severe.  Do not smoke or allow others to smoke around you. Avoid smoky places. Smoking makes asthma worse. If you need help quitting, talk to your doctor about stop-smoking programs and medicines. These can increase your chances of quitting for good.  Learn what triggers an asthma attack for you, and avoid the triggers when you can. Common triggers include colds, smoke, air pollution, dust, pollen, mold, pets, cockroaches, stress, and cold air.  Avoid colds and the flu. Get a pneumococcal vaccine shot. If  you have had one before, ask your doctor if you need a second dose. Get a flu vaccine every fall. If you must be around people with colds or the flu, wash your hands often.  To control asthma over the long term   Medicines  Controller medicines reduce swelling in your lungs. They also prevent asthma attacks. Take your controller medicine exactly as prescribed. Talk to your doctor if you have any problems with your medicine.  Inhaled corticosteroid is a common and effective controller medicine. Using it  the right way can prevent or reduce most side effects.  Take your controller medicine every day, not just when you have symptoms. It helps prevent problems before they occur.  Your doctor may prescribe another medicine that you use along with the corticosteroid. This is often a long-acting bronchodilator. Do not take this medicine by itself. Using a long-acting bronchodilator by itself can increase your risk of a severe or fatal asthma attack.  Do not take inhaled corticosteroids or long-acting bronchodilators to stop an asthma attack that has already started. They don't work fast enough to help.  Talk to your doctor before you use other medicines. Some medicines, such as aspirin, can cause asthma attacks in some people.  Education  Learn what triggers an asthma attack. Avoid these triggers when you can. Common triggers include colds, smoke, air pollution, dust, pollen, mold, pets, cockroaches, stress, and cold air.  If you have a peak flow meter, use it to check how well you are breathing. It can help you know when an asthma attack is going to occur. Then you can take medicine to prevent the asthma attack or make it less severe.  Do not smoke or allow others to smoke around you. Avoid smoky places. Smoking makes asthma worse. If you need help quitting, talk to your doctor about stop-smoking programs and medicines. These can increase your chances of quitting for good.  Avoid colds and the flu. Get a pneumococcal vaccine shot. If you have had one before, ask your doctor whether you need a second dose. Get a flu vaccine every year, as soon as it's available. If you must be around people with colds or the flu, wash your hands often.

## 2016-04-09 NOTE — ED Triage Notes (Addendum)
Pt presents to ED with c/o sob and congestion since Friday. Hx of asthma. Seen in this ED Friday night for the same. Increased work of breathing noted at this time. Inhaler and neb tx at home with no relief.

## 2016-04-17 ENCOUNTER — Emergency Department
Admission: EM | Admit: 2016-04-17 | Discharge: 2016-04-17 | Disposition: A | Discharge status: Home / Self Care | Payer: Medicaid Other | Attending: Emergency Medicine | Admitting: Emergency Medicine

## 2016-04-17 ENCOUNTER — Emergency Department: Payer: Medicaid Other

## 2016-04-17 DIAGNOSIS — F17210 Nicotine dependence, cigarettes, uncomplicated: Secondary | ICD-10-CM | POA: Diagnosis not present

## 2016-04-17 DIAGNOSIS — R0602 Shortness of breath: Secondary | ICD-10-CM | POA: Diagnosis present

## 2016-04-17 DIAGNOSIS — J45901 Unspecified asthma with (acute) exacerbation: Secondary | ICD-10-CM | POA: Diagnosis not present

## 2016-04-17 MED ORDER — PREDNISONE 20 MG PO TABS: ORAL_TABLET | ORAL | 0 refills | Status: DC

## 2016-04-17 MED ORDER — IPRATROPIUM-ALBUTEROL 0.5-2.5 (3) MG/3ML IN SOLN: 3.0000 mL | Freq: Once | RESPIRATORY_TRACT | Status: DC

## 2016-04-17 MED ORDER — BECLOMETHASONE DIPROPIONATE 40 MCG/ACT IN AERS: 1.0000 | INHALATION_SPRAY | Freq: Two times a day (BID) | RESPIRATORY_TRACT | 1 refills | Status: DC

## 2016-04-17 MED ORDER — IPRATROPIUM-ALBUTEROL 0.5-2.5 (3) MG/3ML IN SOLN
3.0000 mL | Freq: Once | RESPIRATORY_TRACT | Status: AC
  Administered 2016-04-17: 3 mL via RESPIRATORY_TRACT
  Filled 2016-04-17: qty 3

## 2016-04-17 MED ORDER — METHYLPREDNISOLONE SODIUM SUCC 125 MG IJ SOLR
125.0000 mg | Freq: Once | INTRAMUSCULAR | Status: AC
  Administered 2016-04-17: 125 mg via INTRAVENOUS
  Filled 2016-04-17: qty 2

## 2016-04-17 MED ORDER — ALBUTEROL SULFATE HFA 108 (90 BASE) MCG/ACT IN AERS: 2.0000 | INHALATION_SPRAY | Freq: Four times a day (QID) | RESPIRATORY_TRACT | 2 refills | Status: DC | PRN

## 2016-04-17 MED ORDER — ALBUTEROL SULFATE (2.5 MG/3ML) 0.083% IN NEBU
10.0000 mg/h | INHALATION_SOLUTION | RESPIRATORY_TRACT | Status: DC
  Administered 2016-04-17: 10 mg/h via RESPIRATORY_TRACT
  Filled 2016-04-17: qty 3

## 2016-04-17 MED ORDER — ALBUTEROL SULFATE (2.5 MG/3ML) 0.083% IN NEBU
10.0000 mg | INHALATION_SOLUTION | Freq: Once | RESPIRATORY_TRACT | Status: AC
  Administered 2016-04-17: 10 mg via RESPIRATORY_TRACT
  Filled 2016-04-17: qty 12

## 2016-04-17 NOTE — ED Notes (Signed)
Pt is finishing his cont neb, resting with his eyes closed, no distress noted, cont to monitor

## 2016-04-17 NOTE — ED Triage Notes (Signed)
Pt in with co shob states has been here for the same recently. Mild shob noted at this time, was given prednisone but could not afford the advair.

## 2016-04-17 NOTE — ED Provider Notes (Signed)
[facility] Emergency Department Provider Note  ____________________________________________  Time seen: Approximately 5:43 AM  I have reviewed the triage vital signs and the nursing notes.   HISTORY  Chief Complaint Shortness of Breath   HPI Carlos Maddox is a 45 y.o. male with a history of asthma and smoking who presents for evaluation of shortness of breath. Patient was seen here a week ago for an asthma exacerbation and negative flu. He was discharged home on prednisone, albuterol and Advair. He reports that he was unable to afford the Advair. He took the prednisone and reports that he felt better however started having worsening shortness of breath over the course of the last few days much worse this morning. He reports that he is almost out of his rescue inhaler. He denies fever or chills, has had a dry cough, no chest pain, no abdominal pain, no nausea, no vomiting, no sore throat. Patient has not received flu shot this year. Patient continues to smoke.  Past Medical History:  Diagnosis Date  . Asthma     There are no active problems to display for this patient.   Past Surgical History:  Procedure Laterality Date  . PARTIAL HIP ARTHROPLASTY      Prior to Admission medications   Medication Sig Start Date End Date Taking? Authorizing Provider  albuterol (PROVENTIL HFA;VENTOLIN HFA) 108 (90 Base) MCG/ACT inhaler Inhale 2 puffs into the lungs every 6 (six) hours as needed for wheezing or shortness of breath. 04/17/16   Nita Sickle, MD  beclomethasone (QVAR) 40 MCG/ACT inhaler Inhale 1 puff into the lungs 2 (two) times daily. 04/17/16   Nita Sickle, MD  ibuprofen (ADVIL,MOTRIN) 800 MG tablet Take 1 tablet (800 mg total) by mouth every 8 (eight) hours as needed. Patient not taking: Reported on 04/09/2016 08/28/14   Phineas Semen, MD  predniSONE (DELTASONE) 20 MG tablet Take 60mg  a day for 2 days Take 40mg  for 2 days Take 20mg  for 2 days   stop 04/17/16   Nita Sickle, MD    Allergies Patient has no known allergies.  No family history on file.  Social History Social History  Substance Use Topics  . Smoking status: Current Every Day Smoker    Types: Cigarettes  . Smokeless tobacco: Never Used  . Alcohol use Yes    Review of Systems Constitutional: Negative for fever. Eyes: Negative for visual changes. ENT: Negative for sore throat. Neck: No neck pain  Cardiovascular: Negative for chest pain. Respiratory: + shortness of breath and wheezing Gastrointestinal: Negative for abdominal pain, vomiting or diarrhea. Genitourinary: Negative for dysuria. Musculoskeletal: Negative for back pain. Skin: Negative for rash. Neurological: Negative for headaches, weakness or numbness. Psych: No SI or HI  ____________________________________________   PHYSICAL EXAM:  VITAL SIGNS: ED Triage Vitals  Enc Vitals Group     BP 04/17/16 0518 132/84     Pulse Rate 04/17/16 0518 85     Resp 04/17/16 0518 (!) 24     Temp 04/17/16 0518 97.8 F (36.6 C)     Temp Source 04/17/16 0518 Oral     SpO2 04/17/16 0518 92 %     Weight 04/17/16 0519 150 lb (68 kg)     Height 04/17/16 0519 6' (1.829 m)     Head Circumference --      Peak Flow --      Pain Score 04/17/16 0519 10     Pain Loc --      Pain Edu? --  Excl. in GC? --     Constitutional: Alert and oriented. Well appearing and in no apparent distress. HEENT:      Head: Normocephalic and atraumatic.         Eyes: Conjunctivae are normal. Sclera is non-icteric. EOMI. PERRL      Mouth/Throat: Mucous membranes are moist.       Neck: Supple with no signs of meningismus. Cardiovascular: Regular rate and rhythm. No murmurs, gallops, or rubs. 2+ symmetrical distal pulses are present in all extremities. No JVD. Respiratory: Mildly increased work of breathing, respiratory rate of 24, satting 92% on room air, diffuse expiratory wheezes and decreased air movement bilaterally   Gastrointestinal: Soft, non tender, and non distended with positive bowel sounds. No rebound or guarding. Musculoskeletal: Nontender with normal range of motion in all extremities. No edema, cyanosis, or erythema of extremities. Neurologic: Normal speech and language. Face is symmetric. Moving all extremities. No gross focal neurologic deficits are appreciated. Skin: Skin is warm, dry and intact. No rash noted. Psychiatric: Mood and affect are normal. Speech and behavior are normal.  ____________________________________________   LABS (all labs ordered are listed, but only abnormal results are displayed)  Labs Reviewed - No data to display ____________________________________________  EKG  ED ECG REPORT I, Nita Sicklearolina Roizy Harold, the attending physician, personally viewed and interpreted this ECG.  Normal sinus rhythm, rate of 82, normal intervals, normal axis, no ST elevations or depressions. ____________________________________________  RADIOLOGY  CXR: stable hyperinflation with no acute abnormalities ____________________________________________   PROCEDURES  Procedure(s) performed: None Procedures Critical Care performed: yes  CRITICAL CARE Performed by: Nita Sicklearolina Meika Earll  ?  Total critical care time: 35 min  Critical care time was exclusive of separately billable procedures and treating other patients.  Critical care was necessary to treat or prevent imminent or life-threatening deterioration.  Critical care was time spent personally by me on the following activities: development of treatment plan with patient and/or surrogate as well as nursing, discussions with consultants, evaluation of patient's response to treatment, examination of patient, obtaining history from patient or surrogate, ordering and performing treatments and interventions, ordering and review of laboratory studies, ordering and review of radiographic studies, pulse oximetry and re-evaluation of  patient's condition.  ____________________________________________   INITIAL IMPRESSION / ASSESSMENT AND PLAN / ED COURSE  45 y.o. male with a history of asthma and smoking who presents for evaluation of shortness of breath and wheezing. Patient with increased work of breathing, mid 20s respiratory rate and satting low 90s on room air, has diffuse expiratory wheezes and diminished air movement consistent with an asthma exacerbation. Patient will be given 3 duo nebs, IV Solu-Medrol, chest x-ray and EKG. Patient placed on cardiac telemetry.  Clinical Course as of Apr 18 707  Fri Apr 17, 2016  [id number] Patient received 3 duoneb treatments and continues to have SOB and wheezing. Will start on albuterol 10mg  for 1 hour and reassess. CXR with no evidence of PNA, PTX, CHF  [CV]  0708 Plan to reassess after 1hour of continuous. Care transferred to Dr. Huel CoteQuigley  [CV]    Clinical Course User Index [CV] Nita Sicklearolina Darious Rehman, MD    Pertinent labs & imaging results that were available during my care of the patient were reviewed by me and considered in my medical decision making (see chart for details).    ____________________________________________   FINAL CLINICAL IMPRESSION(S) / ED DIAGNOSES  Final diagnoses:  Moderate asthma with exacerbation, unspecified whether persistent      NEW MEDICATIONS STARTED  DURING THIS VISIT:  New Prescriptions   ALBUTEROL (PROVENTIL HFA;VENTOLIN HFA) 108 (90 BASE) MCG/ACT INHALER    Inhale 2 puffs into the lungs every 6 (six) hours as needed for wheezing or shortness of breath.   BECLOMETHASONE (QVAR) 40 MCG/ACT INHALER    Inhale 1 puff into the lungs 2 (two) times daily.   PREDNISONE (DELTASONE) 20 MG TABLET    Take 60mg  a day for 2 days Take 40mg  for 2 days Take 20mg  for 2 days  stop     Note:  This document was prepared using Dragon voice recognition software and may include unintentional dictation errors.    Nita Sicklearolina Araya Roel, MD 04/17/16 [phone]

## 2016-04-17 NOTE — ED Notes (Signed)
ED Provider at bedside. 

## 2016-04-17 NOTE — ED Notes (Signed)
Pt on phone, no distress noted.

## 2016-04-17 NOTE — Care Management (Signed)
Patient is listed as having Medicaid which should cover most brand name drugs. Patient may go online for coupons/free sample eligibility-  [email or web address]

## 2016-04-17 NOTE — ED Notes (Signed)
XR at bedside

## 2016-04-17 NOTE — Discharge Instructions (Signed)
Take the steroid taper as prescribed. Use your rescue inhaler as needed for shortness of breath. Use Qvar twice a day every day to prevent exacerbations. Follow up with your doctor in 1-2 days. Stop smoking!!!

## 2016-04-17 NOTE — ED Notes (Signed)
Pt sleeping, room air saturation at 90-91%

## 2016-04-17 NOTE — ED Provider Notes (Signed)
-----------------------------------------   10:16 AM on 04/17/2016 -----------------------------------------   Blood pressure 119/66, pulse 76, temperature 97.8 F (36.6 C), temperature source Oral, resp. rate 14, height 6' (1.829 m), weight 150 lb (68 kg), SpO2 91 %.  Assuming care from Dr. Don PerkingVeronese.  In short, Lenox L Mayford Knifeurner is a 45 y.o. male with a chief complaint of Shortness of Breath .  Refer to the original H&P for additional details.  The current plan of care is to discharge the patient. The patient received 2 stacked sets of breathing treatments here in emergency department with symptomatic improvement. At the time of discharge she feels improved his, his pulse ox is at 98% on room air, and repeat exam just shows some very mild and expiratory wheezing heard at the apices. Feels comfortable for discharge and will be discharged on the medications prescribed by Dr. Saunders RevelVeronese    Brian S Quigley, MD 04/17/16 1017

## 2016-07-16 ENCOUNTER — Encounter: Payer: Self-pay | Admitting: Emergency Medicine

## 2016-07-16 ENCOUNTER — Emergency Department
Admission: EM | Admit: 2016-07-16 | Discharge: 2016-07-16 | Disposition: A | Discharge status: Home / Self Care | Payer: Medicaid Other | Attending: Emergency Medicine | Admitting: Emergency Medicine

## 2016-07-16 DIAGNOSIS — J45901 Unspecified asthma with (acute) exacerbation: Secondary | ICD-10-CM | POA: Insufficient documentation

## 2016-07-16 DIAGNOSIS — Z79899 Other long term (current) drug therapy: Secondary | ICD-10-CM | POA: Insufficient documentation

## 2016-07-16 DIAGNOSIS — Z96649 Presence of unspecified artificial hip joint: Secondary | ICD-10-CM | POA: Insufficient documentation

## 2016-07-16 DIAGNOSIS — F17210 Nicotine dependence, cigarettes, uncomplicated: Secondary | ICD-10-CM | POA: Insufficient documentation

## 2016-07-16 MED ORDER — PREDNISONE 20 MG PO TABS
ORAL_TABLET | ORAL | Status: AC
  Administered 2016-07-16: 60 mg via ORAL
  Filled 2016-07-16: qty 3

## 2016-07-16 MED ORDER — IPRATROPIUM-ALBUTEROL 0.5-2.5 (3) MG/3ML IN SOLN
3.0000 mL | Freq: Once | RESPIRATORY_TRACT | Status: AC
  Administered 2016-07-16: 3 mL via RESPIRATORY_TRACT

## 2016-07-16 MED ORDER — ALBUTEROL SULFATE HFA 108 (90 BASE) MCG/ACT IN AERS: 2.0000 | INHALATION_SPRAY | Freq: Four times a day (QID) | RESPIRATORY_TRACT | 2 refills | Status: DC | PRN

## 2016-07-16 MED ORDER — IPRATROPIUM-ALBUTEROL 0.5-2.5 (3) MG/3ML IN SOLN
RESPIRATORY_TRACT | Status: AC
  Administered 2016-07-16: 3 mL via RESPIRATORY_TRACT
  Filled 2016-07-16: qty 9

## 2016-07-16 MED ORDER — IPRATROPIUM-ALBUTEROL 0.5-2.5 (3) MG/3ML IN SOLN
RESPIRATORY_TRACT | Status: AC
  Administered 2016-07-16: 3 mL via RESPIRATORY_TRACT
  Filled 2016-07-16: qty 3

## 2016-07-16 MED ORDER — PREDNISONE 20 MG PO TABS: 60.0000 mg | ORAL_TABLET | Freq: Every day | ORAL | 0 refills | Status: AC

## 2016-07-16 MED ORDER — PREDNISONE 20 MG PO TABS
60.0000 mg | ORAL_TABLET | Freq: Once | ORAL | Status: AC
  Administered 2016-07-16: 60 mg via ORAL

## 2016-07-16 NOTE — ED Provider Notes (Signed)
[facility] Emergency Department Provider Note    First MD Initiated Contact with Patient 07/16/16 [phone]     (approximate)  I have reviewed the triage vital signs and the nursing notes.   HISTORY  Chief Complaint    Asthma    HPI Carlos Maddox is a 46 y.o. male with history of asthma presents to the emergency department with wheezing with onset yesterday evening unrelieved with home inhaler. Patient denies any fever no cough.   Past Medical History:  Diagnosis Date  . Asthma     There are no active problems to display for this patient.   Past Surgical History:  Procedure Laterality Date  . PARTIAL HIP ARTHROPLASTY      Prior to Admission medications   Medication Sig Start Date End Date Taking? Authorizing Provider  albuterol (PROVENTIL HFA;VENTOLIN HFA) 108 (90 Base) MCG/ACT inhaler Inhale 2 puffs into the lungs every 6 (six) hours as needed for wheezing or shortness of breath. 04/17/16   Nita Sicklearolina Veronese, MD  beclomethasone (QVAR) 40 MCG/ACT inhaler Inhale 1 puff into the lungs 2 (two) times daily. 04/17/16   Nita Sicklearolina Veronese, MD  ibuprofen (ADVIL,MOTRIN) 800 MG tablet Take 1 tablet (800 mg total) by mouth every 8 (eight) hours as needed. Patient not taking: Reported on 04/09/2016 08/28/14   Phineas SemenGraydon Goodman, MD  predniSONE (DELTASONE) 20 MG tablet Take 60mg  a day for 2 days Take 40mg  for 2 days Take 20mg  for 2 days  stop 04/17/16   Nita Sicklearolina Veronese, MD    Allergies No known drug allergies No family history on file.  Social History Social History  Substance Use Topics  . Smoking status: Current Every Day Smoker    Types: Cigarettes  . Smokeless tobacco: Never Used  . Alcohol use Yes    Review of Systems Constitutional: No fever/chills Eyes: No visual changes. ENT: No sore throat. Cardiovascular: Denies chest pain. Respiratory: Denies shortness of breath.Positive for wheezing Gastrointestinal: No abdominal pain.  No nausea,  no vomiting.  No diarrhea.  No constipation. Genitourinary: Negative for dysuria. Musculoskeletal: Negative for back pain. Skin: Negative for rash. Neurological: Negative for headaches, focal weakness or numbness.  10-point ROS otherwise negative.  ____________________________________________   PHYSICAL EXAM:  VITAL SIGNS: ED Triage Vitals  Enc Vitals Group     BP 07/16/16 0106 (!) 172/100     Pulse Rate 07/16/16 0106 93     Resp 07/16/16 0106 (!) 22     Temp 07/16/16 0106 98.3 F (36.8 C)     Temp Source 07/16/16 0106 Oral     SpO2 07/16/16 0106 97 %     Weight 07/16/16 0106 140 lb (63.5 kg)     Height 07/16/16 0106 6' (1.829 m)     Head Circumference --      Peak Flow --      Pain Score 07/16/16 0432 6     Pain Loc --      Pain Edu? --      Excl. in GC? --     Constitutional: Alert and oriented. Well appearing and in no acute distress. Eyes: Conjunctivae are normal. PERRL. EOMI. Head: Atraumatic. Mouth/Throat: Mucous membranes are moist.  Oropharynx non-erythematous. Neck: No stridor.   Cardiovascular: Normal rate, regular rhythm. Good peripheral circulation. Grossly normal heart sounds. Respiratory: Normal respiratory effort.  No retractions. Moderate expiratory wheezing. Gastrointestinal: Soft and nontender. No distention.  Musculoskeletal: No lower extremity tenderness nor edema. No gross deformities of extremities. Neurologic:  Normal  speech and language. No gross focal neurologic deficits are appreciated.  Skin:  Skin is warm, dry and intact. No rash noted.      Procedures   ____________________________________________   INITIAL IMPRESSION / ASSESSMENT AND PLAN / ED COURSE  Pertinent labs & imaging results that were available during my care of the patient were reviewed by me and considered in my medical decision making (see chart for details).  Patient received 3 DuoNeb's and 60 prednisone in the emergency department with complete resolution of  wheezing. Patient advised to follow-up with primary care provider for further management.      ____________________________________________  FINAL CLINICAL IMPRESSION(S) / ED DIAGNOSES  Final diagnoses:  Moderate asthma with exacerbation, unspecified whether persistent     MEDICATIONS GIVEN DURING THIS VISIT:  Medications  ipratropium-albuterol (DUONEB) 0.5-2.5 (3) MG/3ML nebulizer solution 3 mL (3 mLs Nebulization Given 07/16/16 0118)  ipratropium-albuterol (DUONEB) 0.5-2.5 (3) MG/3ML nebulizer solution 3 mL (3 mLs Nebulization Given 07/16/16 0118)  ipratropium-albuterol (DUONEB) 0.5-2.5 (3) MG/3ML nebulizer solution 3 mL (3 mLs Nebulization Given 07/16/16 0118)  predniSONE (DELTASONE) tablet 60 mg (60 mg Oral Given 07/16/16 0449)  ipratropium-albuterol (DUONEB) 0.5-2.5 (3) MG/3ML nebulizer solution 3 mL (3 mLs Nebulization Given 07/16/16 0449)     NEW OUTPATIENT MEDICATIONS STARTED DURING THIS VISIT:  New Prescriptions   No medications on file    Modified Medications   No medications on file    Discontinued Medications   No medications on file     Note:  This document was prepared using Dragon voice recognition software and may include unintentional dictation errors.    Darci Current, MD 07/17/16 [phone]

## 2016-07-16 NOTE — ED Triage Notes (Signed)
Patient ambulatory to triage with steady gait, without difficulty or distress noted; pt reports hx asthma, with wheezing this evening unrelieved by his inhaler; denies any recent illness; denies any cough

## 2016-07-21 ENCOUNTER — Encounter: Payer: Self-pay | Admitting: Emergency Medicine

## 2016-07-21 ENCOUNTER — Emergency Department
Admission: EM | Admit: 2016-07-21 | Discharge: 2016-07-21 | Disposition: A | Discharge status: Home / Self Care | Payer: Self-pay | Attending: Emergency Medicine | Admitting: Emergency Medicine

## 2016-07-21 ENCOUNTER — Emergency Department: Payer: Self-pay

## 2016-07-21 DIAGNOSIS — F17210 Nicotine dependence, cigarettes, uncomplicated: Secondary | ICD-10-CM | POA: Insufficient documentation

## 2016-07-21 DIAGNOSIS — Z79899 Other long term (current) drug therapy: Secondary | ICD-10-CM | POA: Insufficient documentation

## 2016-07-21 DIAGNOSIS — J4521 Mild intermittent asthma with (acute) exacerbation: Secondary | ICD-10-CM | POA: Insufficient documentation

## 2016-07-21 MED ORDER — METHYLPREDNISOLONE 4 MG PO TBPK: ORAL_TABLET | ORAL | 0 refills | Status: DC

## 2016-07-21 MED ORDER — ALBUTEROL SULFATE (2.5 MG/3ML) 0.083% IN NEBU
5.0000 mg | INHALATION_SOLUTION | Freq: Once | RESPIRATORY_TRACT | Status: AC
  Administered 2016-07-21: 5 mg via RESPIRATORY_TRACT

## 2016-07-21 MED ORDER — ALBUTEROL SULFATE HFA 108 (90 BASE) MCG/ACT IN AERS: 2.0000 | INHALATION_SPRAY | Freq: Four times a day (QID) | RESPIRATORY_TRACT | 2 refills | Status: DC | PRN

## 2016-07-21 MED ORDER — IPRATROPIUM-ALBUTEROL 0.5-2.5 (3) MG/3ML IN SOLN
3.0000 mL | Freq: Once | RESPIRATORY_TRACT | Status: AC
  Administered 2016-07-21: 3 mL via RESPIRATORY_TRACT
  Filled 2016-07-21: qty 3

## 2016-07-21 MED ORDER — ALBUTEROL SULFATE (2.5 MG/3ML) 0.083% IN NEBU
INHALATION_SOLUTION | RESPIRATORY_TRACT | Status: AC
  Administered 2016-07-21: 5 mg via RESPIRATORY_TRACT
  Filled 2016-07-21: qty 6

## 2016-07-21 NOTE — ED Notes (Signed)
See triage note  States he was seen last week for same but unable to afford inhaler  Some wheezing on arrival  But clear at present no fever or other sxs'

## 2016-07-21 NOTE — ED Triage Notes (Signed)
Patient presents to the ED with asthma exacerbation and shortness of breath.  Patient reports that he recently ran out of his inhaler and has not had insurance or the money to get another inhaler.  Patient given information regarding the medication management clinic at this time.  Expiratory wheezes auscultated on both sides.  Patient speaking in partial sentences prior to breathing treatment.

## 2016-07-21 NOTE — ED Provider Notes (Signed)
[facility] Emergency Department Provider Note  ____________________________________________   First MD Initiated Contact with Patient 07/21/16 1422     (approximate)  I have reviewed the triage vital signs and the nursing notes.   HISTORY  Chief Complaint Asthma and Shortness of Breath  HPI Carlos Maddox is a 46 y.o. male with history of asthma presents with acute onset of wheezing and shortness of breath. He reports that this feels like a typical asthma exacerbation, but he ran out of his albuterol inhaler and could not afford to refill his prescription. He had a similar problem last week as well. He complains of some chest pain and lightheadedness associated with shortness of breath.    Past Medical History:  Diagnosis Date  . Asthma     There are no active problems to display for this patient.   Past Surgical History:  Procedure Laterality Date  . PARTIAL HIP ARTHROPLASTY      Prior to Admission medications   Medication Sig Start Date End Date Taking? Authorizing Provider  albuterol (PROVENTIL HFA;VENTOLIN HFA) 108 (90 Base) MCG/ACT inhaler Inhale 2 puffs into the lungs every 6 (six) hours as needed for wheezing or shortness of breath. 07/16/16   Darci Current, MD  albuterol (PROVENTIL HFA;VENTOLIN HFA) 108 (90 Base) MCG/ACT inhaler Inhale 2 puffs into the lungs every 6 (six) hours as needed for wheezing or shortness of breath. 07/21/16   Joni Reining, PA-C  methylPREDNISolone (MEDROL DOSEPAK) 4 MG TBPK tablet Take Tapered dose as directed 07/21/16   Joni Reining, PA-C  predniSONE (DELTASONE) 20 MG tablet Take 3 tablets (60 mg total) by mouth daily. 07/16/16 07/21/16  Darci Current, MD    Allergies Patient has no known allergies.  No family history on file.  Social History Social History  Substance Use Topics  . Smoking status: Current Every Day Smoker    Types: Cigarettes  . Smokeless tobacco: Never Used  . Alcohol use Yes     Review of Systems Constitutional: No fever/chills Cardiovascular: Chest pain associated with dyspnea. Respiratory: Shortness of breath, wheezing. Gastrointestinal: No nausea, no vomiting.  No diarrhea.  No constipation. Musculoskeletal: Negative for back pain. Skin: Negative for rash. Neurological: Lightheadedness. Negative for focal weakness or numbness.  ____________________________________________   PHYSICAL EXAM:  VITAL SIGNS: ED Triage Vitals  Enc Vitals Group     BP 07/21/16 1346 109/61     Pulse Rate 07/21/16 1346 (!) 101     Resp 07/21/16 1346 (!) 24     Temp 07/21/16 1346 98.4 F (36.9 C)     Temp Source 07/21/16 1346 Oral     SpO2 07/21/16 1346 95 %     Weight 07/21/16 1346 140 lb (63.5 kg)     Height 07/21/16 1346 6' (1.829 m)     Head Circumference --      Peak Flow --      Pain Score 07/21/16 1345 6     Pain Loc --      Pain Edu? --      Excl. in GC? --    Constitutional: Alert and oriented. Mild respiratory distress. Eyes: Conjunctivae are normal. PERRL. Head: Atraumatic. Cardiovascular: Tachycardia, regular rhythm. Grossly normal heart sounds.  Good peripheral circulation. Respiratory: Tachypnea, some wheezing on inspiration.  Increased work of breathing.  No retractions. Musculoskeletal: No lower extremity tenderness nor edema.  Neurologic:  Normal speech and language. No gross focal neurologic deficits are appreciated.  Skin:  Skin  is warm, dry and intact. No rash noted. Psychiatric: Mood and affect are normal. Speech and behavior are normal.  ____________________________________________   LABS (all labs ordered are listed, but only abnormal results are displayed)  Labs Reviewed - No data to display ____________________________________________  EKG Sinus tachycardia.  Biatrial enlargement.  ____________________________________________  RADIOLOGY None ____________________________________________   PROCEDURES  Procedure(s) performed:  None  Procedures  Critical Care performed: No  ____________________________________________   INITIAL IMPRESSION / ASSESSMENT AND PLAN / ED COURSE  Pertinent labs & imaging results that were available during my care of the patient were reviewed by me and considered in my medical decision making (see chart for details).  Pt with acute asthma exacerbation, did not refill prescription for albuterol inhaler. Albuterol nebulizer and DuoNeb treatments in ED.       ____________________________________________   FINAL CLINICAL IMPRESSION(S) / ED DIAGNOSES  Final diagnoses:  Mild intermittent asthma with exacerbation   Discussed negative straight final with patient. Advised patient to go pick up medications prescribed to alleviate his complaint.   NEW MEDICATIONS STARTED DURING THIS VISIT:  New Prescriptions   ALBUTEROL (PROVENTIL HFA;VENTOLIN HFA) 108 (90 BASE) MCG/ACT INHALER    Inhale 2 puffs into the lungs every 6 (six) hours as needed for wheezing or shortness of breath.   METHYLPREDNISOLONE (MEDROL DOSEPAK) 4 MG TBPK TABLET    Take Tapered dose as directed     Note:  This document was prepared using Dragon voice recognition software and may include unintentional dictation errors.   Joni Reining, PA-C 07/21/16 1515    Emily Filbert, MD 07/22/16 [phone]

## 2016-07-21 NOTE — ED Notes (Signed)
Went in to discharge pt  He states he is not going to sign and that he would go to [facility] where he can get his meds for free  Pt did take instruction sheet and rx

## 2016-08-29 ENCOUNTER — Encounter (HOSPITAL_COMMUNITY): Payer: Self-pay | Admitting: *Deleted

## 2016-08-29 ENCOUNTER — Emergency Department (HOSPITAL_COMMUNITY): Payer: Medicaid Other

## 2016-08-29 ENCOUNTER — Emergency Department (HOSPITAL_COMMUNITY)
Admission: EM | Admit: 2016-08-29 | Discharge: 2016-08-30 | Disposition: A | Discharge status: Home / Self Care | Payer: Medicaid Other | Attending: Emergency Medicine | Admitting: Emergency Medicine

## 2016-08-29 DIAGNOSIS — Z96649 Presence of unspecified artificial hip joint: Secondary | ICD-10-CM | POA: Insufficient documentation

## 2016-08-29 DIAGNOSIS — F17210 Nicotine dependence, cigarettes, uncomplicated: Secondary | ICD-10-CM | POA: Insufficient documentation

## 2016-08-29 DIAGNOSIS — J4551 Severe persistent asthma with (acute) exacerbation: Secondary | ICD-10-CM

## 2016-08-29 MED ORDER — IPRATROPIUM-ALBUTEROL 0.5-2.5 (3) MG/3ML IN SOLN
RESPIRATORY_TRACT | Status: AC
  Administered 2016-08-29: 19:00:00
  Filled 2016-08-29: qty 3

## 2016-08-29 MED ORDER — DEXAMETHASONE SODIUM PHOSPHATE 10 MG/ML IJ SOLN
10.0000 mg | Freq: Once | INTRAMUSCULAR | Status: AC
  Administered 2016-08-29: 10 mg via INTRAMUSCULAR
  Filled 2016-08-29: qty 1

## 2016-08-29 MED ORDER — IPRATROPIUM-ALBUTEROL 0.5-2.5 (3) MG/3ML IN SOLN
3.0000 mL | Freq: Once | RESPIRATORY_TRACT | Status: AC
  Administered 2016-08-29: 3 mL via RESPIRATORY_TRACT
  Filled 2016-08-29: qty 3

## 2016-08-29 MED ORDER — IPRATROPIUM BROMIDE 0.02 % IN SOLN
1.0000 mg | Freq: Once | RESPIRATORY_TRACT | Status: AC
  Administered 2016-08-29: 1 mg via RESPIRATORY_TRACT

## 2016-08-29 MED ORDER — ALBUTEROL (5 MG/ML) CONTINUOUS INHALATION SOLN
10.0000 mg/h | INHALATION_SOLUTION | RESPIRATORY_TRACT | Status: DC
  Administered 2016-08-29: 10 mg/h via RESPIRATORY_TRACT
  Filled 2016-08-29: qty 20

## 2016-08-29 MED ORDER — IPRATROPIUM BROMIDE 0.02 % IN SOLN
RESPIRATORY_TRACT | Status: AC
  Filled 2016-08-29: qty 5

## 2016-08-29 MED ORDER — ALBUTEROL SULFATE (2.5 MG/3ML) 0.083% IN NEBU
INHALATION_SOLUTION | RESPIRATORY_TRACT | Status: AC
  Filled 2016-08-29: qty 6

## 2016-08-29 MED ORDER — ALBUTEROL SULFATE HFA 108 (90 BASE) MCG/ACT IN AERS
2.0000 | INHALATION_SPRAY | Freq: Once | RESPIRATORY_TRACT | Status: AC
  Administered 2016-08-29: 2 via RESPIRATORY_TRACT
  Filled 2016-08-29: qty 6.7

## 2016-08-29 NOTE — ED Triage Notes (Signed)
To ED for eval of sob. Pt with audible wheezing. Skin w/d. States the sob just started pta. Unable to speak in full sentences.

## 2016-08-29 NOTE — ED Provider Notes (Signed)
[facility]-EMERGENCY DEPT Provider Note   CSN: [id number] Arrival date & time: 08/29/16  1836     History   Chief Complaint Chief Complaint  Patient presents with  . Shortness of Breath    HPI Carlos Maddox is a 46 y.o. male.  HPI Patient presents with concern of dyspnea, wheezing. Patient was at rest, about 5 hours ago when he developed symptoms. Patient was well prior to this. Patient acknowledges a history of asthma, states that he has no medication. He states that he typically goes to the emergency room for his medication. He also acknowledges smoking. We had a lengthy conversation about smoking cessation, options for doing so, the importance of doing so. Patient is here with his mother who assists with the history of present illness. Patient denies other focal complaints, including pain, fever, vomiting, nausea, syncope. Past Medical History:  Diagnosis Date  . Asthma     There are no active problems to display for this patient.   Past Surgical History:  Procedure Laterality Date  . PARTIAL HIP ARTHROPLASTY         Home Medications    Prior to Admission medications   Medication Sig Start Date End Date Taking? Authorizing Provider  albuterol (PROVENTIL HFA;VENTOLIN HFA) 108 (90 Base) MCG/ACT inhaler Inhale 2 puffs into the lungs every 6 (six) hours as needed for wheezing or shortness of breath. 07/16/16   Darci CurrentBrown, [place] N, MD  albuterol (PROVENTIL HFA;VENTOLIN HFA) 108 (90 Base) MCG/ACT inhaler Inhale 2 puffs into the lungs every 6 (six) hours as needed for wheezing or shortness of breath. 07/21/16   Joni ReiningSmith, Ronald K, PA-C  methylPREDNISolone (MEDROL DOSEPAK) 4 MG TBPK tablet Take Tapered dose as directed 07/21/16   Joni ReiningSmith, Ronald K, PA-C    Family History No family history on file.  Social History Social History  Substance Use Topics  . Smoking status: Current Every Day Smoker    Types: Cigarettes  . Smokeless tobacco: Never Used  . Alcohol use Yes      Allergies   Patient has no known allergies.   Review of Systems Review of Systems  Constitutional:       Per HPI, otherwise negative  HENT:       Per HPI, otherwise negative  Respiratory:       Per HPI, otherwise negative  Cardiovascular:       Per HPI, otherwise negative  Gastrointestinal: Negative for vomiting.  Endocrine:       Negative aside from HPI  Genitourinary:       Neg aside from HPI   Musculoskeletal:       Per HPI, otherwise negative  Skin: Negative.   Neurological: Negative for syncope.     Physical Exam Updated Vital Signs BP 111/62 (BP Location: Right Arm)   Pulse 64   Temp 97.8 F (36.6 C) (Axillary)   Resp 16   SpO2 94%   Physical Exam  Constitutional: He is oriented to person, place, and time. He appears well-developed. No distress.  HENT:  Head: Normocephalic and atraumatic.  Eyes: Conjunctivae and EOM are normal.  Cardiovascular: Normal rate and regular rhythm.   Pulmonary/Chest: Effort normal. No stridor. No respiratory distress. He has wheezes.  Abdominal: He exhibits no distension.  Musculoskeletal: He exhibits no edema.  Neurological: He is alert and oriented to person, place, and time.  Skin: Skin is warm and dry.  Psychiatric: He has a normal mood and affect.  Nursing note and vitals reviewed.  ED Treatments / Results   Radiology Dg Chest 2 View  Result Date: 08/29/2016 CLINICAL DATA:  Dyspnea EXAM: CHEST  2 VIEW COMPARISON:  07/21/2016 chest radiograph. FINDINGS: Stable cardiomediastinal silhouette with normal heart size. No pneumothorax. No pleural effusion. No pulmonary edema. No acute consolidative airspace disease. IMPRESSION: No active cardiopulmonary disease. Electronically Signed   By: Delbert Phenix M.D.   On: 08/29/2016 19:39    Procedures Procedures (including critical care time)  Medications Ordered in ED Medications  albuterol (PROVENTIL,VENTOLIN) solution continuous neb (10 mg/hr Nebulization New  Bag/Given 08/29/16 2314)  ipratropium-albuterol (DUONEB) 0.5-2.5 (3) MG/3ML nebulizer solution (  Given 08/29/16 1853)  dexamethasone (DECADRON) injection 10 mg (10 mg Intramuscular Given 08/29/16 2234)  ipratropium-albuterol (DUONEB) 0.5-2.5 (3) MG/3ML nebulizer solution 3 mL (3 mLs Nebulization Given 08/29/16 2231)  albuterol (PROVENTIL HFA;VENTOLIN HFA) 108 (90 Base) MCG/ACT inhaler 2 puff (2 puffs Inhalation Given 08/29/16 2231)  ipratropium (ATROVENT) nebulizer solution 1 mg (1 mg Nebulization Given 08/29/16 2326)   12:21 AM Patient is completing initial albuterol, second DuoNeb, now is almost complete of continuous epidural session, one hour  Initial Impression / Assessment and Plan / ED Course  I have reviewed the triage vital signs and the nursing notes.  Pertinent labs & imaging results that were available during my care of the patient were reviewed by me and considered in my medical decision making (see chart for details).  After multiple breathing treatments the patient is better. No evidence for pneumonia. No evidence for respiratory distress, decompensated asthma exacerbation. Patient has been provided an albuterol inhaler. He'll be discharged with scheduled therapy as well as ongoing steroids are   Asthma exacerbation, severe   Gerhard Munch, MD 08/30/16 [phone]

## 2016-08-29 NOTE — ED Notes (Signed)
Pt still wheezing after treatment, but air movement increased.

## 2016-08-30 MED ORDER — PREDNISONE 20 MG PO TABS: 40.0000 mg | ORAL_TABLET | Freq: Every day | ORAL | 0 refills | Status: DC

## 2016-08-30 NOTE — Discharge Instructions (Signed)
As discussed, for the next few days it is important to you use your provided albuterol inhaler every 4 hours in addition to the prescribed steroids.  Follow-up with your physician, or return here for concerning changes.

## 2017-12-09 ENCOUNTER — Emergency Department
Admission: EM | Admit: 2017-12-09 | Discharge: 2017-12-09 | Disposition: A | Discharge status: Home / Self Care | Payer: Medicaid Other | Attending: Emergency Medicine | Admitting: Emergency Medicine

## 2017-12-09 ENCOUNTER — Encounter: Payer: Self-pay | Admitting: Emergency Medicine

## 2017-12-09 ENCOUNTER — Other Ambulatory Visit: Payer: Self-pay

## 2017-12-09 DIAGNOSIS — F17210 Nicotine dependence, cigarettes, uncomplicated: Secondary | ICD-10-CM | POA: Diagnosis not present

## 2017-12-09 DIAGNOSIS — Z96649 Presence of unspecified artificial hip joint: Secondary | ICD-10-CM | POA: Insufficient documentation

## 2017-12-09 DIAGNOSIS — J441 Chronic obstructive pulmonary disease with (acute) exacerbation: Secondary | ICD-10-CM | POA: Diagnosis not present

## 2017-12-09 DIAGNOSIS — R0602 Shortness of breath: Secondary | ICD-10-CM | POA: Diagnosis present

## 2017-12-09 MED ORDER — IPRATROPIUM-ALBUTEROL 0.5-2.5 (3) MG/3ML IN SOLN
3.0000 mL | Freq: Once | RESPIRATORY_TRACT | Status: AC
  Administered 2017-12-09: 3 mL via RESPIRATORY_TRACT
  Filled 2017-12-09: qty 6

## 2017-12-09 MED ORDER — DEXAMETHASONE SODIUM PHOSPHATE 10 MG/ML IJ SOLN
10.0000 mg | Freq: Once | INTRAMUSCULAR | Status: AC
  Administered 2017-12-09: 10 mg via INTRAMUSCULAR
  Filled 2017-12-09: qty 1

## 2017-12-09 MED ORDER — PREDNISONE 20 MG PO TABS
60.0000 mg | ORAL_TABLET | Freq: Once | ORAL | Status: AC
  Administered 2017-12-09: 60 mg via ORAL
  Filled 2017-12-09: qty 3

## 2017-12-09 MED ORDER — IPRATROPIUM-ALBUTEROL 0.5-2.5 (3) MG/3ML IN SOLN
3.0000 mL | Freq: Once | RESPIRATORY_TRACT | Status: AC
  Administered 2017-12-09: 3 mL via RESPIRATORY_TRACT

## 2017-12-09 MED ORDER — ALBUTEROL SULFATE (2.5 MG/3ML) 0.083% IN NEBU
5.0000 mg | INHALATION_SOLUTION | Freq: Once | RESPIRATORY_TRACT | Status: AC
  Administered 2017-12-09: 5 mg via RESPIRATORY_TRACT
  Filled 2017-12-09: qty 6

## 2017-12-09 MED ORDER — ALBUTEROL SULFATE HFA 108 (90 BASE) MCG/ACT IN AERS
2.0000 | INHALATION_SPRAY | Freq: Once | RESPIRATORY_TRACT | Status: AC
  Administered 2017-12-09: 2 via RESPIRATORY_TRACT
  Filled 2017-12-09: qty 6.7

## 2017-12-09 NOTE — ED Notes (Signed)
See triage note  Presents with some SOB and wheezing   sxs' started 1-2 days ago  Ran out of meds at home  Afebrile on arrival

## 2017-12-09 NOTE — ED Triage Notes (Signed)
Pt comes into the ED via POV c/o asthma and started feeling short of breath last night.  Patient states he has no inhalers or neb treatments at home due to no PCP or insurance.  Patient has minimal wheezing present but is unlabored at this time.  Patient in NAD.

## 2017-12-09 NOTE — ED Provider Notes (Signed)
[facility] Emergency Department Provider Note   ____________________________________________    I have reviewed the triage vital signs and the nursing notes.   HISTORY  Chief Complaint Asthma     HPI Carlos Maddox is a 47 y.o. male who presents with complaints of shortness of breath.  Patient refuses to an asthma exacerbation.  Patient states is been worsening over the last several days.  He denies fevers or chills.  Intermittent dry cough.  No recent travel.  He does smoke cigarettes.  No pleurisy.  No calf pain or swelling   Past Medical History:  Diagnosis Date  . Asthma     There are no active problems to display for this patient.   Past Surgical History:  Procedure Laterality Date  . PARTIAL HIP ARTHROPLASTY      Prior to Admission medications   Medication Sig Start Date End Date Taking? Authorizing Provider  albuterol (PROVENTIL HFA;VENTOLIN HFA) 108 (90 Base) MCG/ACT inhaler Inhale 2 puffs into the lungs every 6 (six) hours as needed for wheezing or shortness of breath. 07/21/16   Joni ReiningSmith, Ronald K, PA-C  predniSONE (DELTASONE) 20 MG tablet Take 2 tablets (40 mg total) by mouth daily with breakfast. For the next four days 08/30/16   Gerhard MunchLockwood, Venola Castello, MD     Allergies Patient has no known allergies.  No family history on file.  Social History Social History   Tobacco Use  . Smoking status: Current Every Day Smoker    Types: Cigarettes  . Smokeless tobacco: Never Used  Substance Use Topics  . Alcohol use: Yes  . Drug use: No    Review of Systems  Constitutional: No fever/chills Eyes: No visual changes.  ENT: No sore throat. Cardiovascular: Denies chest pain. Respiratory: As above Gastrointestinal: No abdominal pain.  No nausea, no vomiting.   Genitourinary: Negative for dysuria. Musculoskeletal: Negative for back pain. Skin: Negative for rash. Neurological: Negative for headaches or  weakness   ____________________________________________   PHYSICAL EXAM:  VITAL SIGNS: ED Triage Vitals  Enc Vitals Group     BP 12/09/17 1221 110/70     Pulse Rate 12/09/17 1221 82     Resp 12/09/17 1221 18     Temp 12/09/17 1221 97.8 F (36.6 C)     Temp Source 12/09/17 1221 Oral     SpO2 12/09/17 1221 96 %     Weight 12/09/17 1222 68 kg (150 lb)     Height 12/09/17 1222 1.854 m (6\' 1" )     Head Circumference --      Peak Flow --      Pain Score 12/09/17 1222 0     Pain Loc --      Pain Edu? --      Excl. in GC? --     Constitutional: Alert and oriented. No acute distress. Pleasant and interactive   Nose: No congestion/rhinnorhea.   Cardiovascular: Normal rate, regular rhythm. Grossly normal heart sounds.  Good peripheral circulation. Respiratory: Normal respiratory effort.  No retractions.  Diffuse wheezing, good airflow Gastrointestinal: Soft and nontender. No distention.   Musculoskeletal: No lower extremity tenderness nor edema.  Warm and well perfused Neurologic:  Normal speech and language. No gross focal neurologic deficits are appreciated.  Skin:  Skin is warm, dry and intact. No rash noted. Psychiatric: Mood and affect are normal. Speech and behavior are normal.  ____________________________________________   LABS (all labs ordered are listed, but only abnormal results are displayed)  Labs  Reviewed - No data to display ____________________________________________  EKG  None ____________________________________________  RADIOLOGY   ____________________________________________   PROCEDURES  Procedure(s) performed: No  Procedures   Critical Care performed: No ____________________________________________   INITIAL IMPRESSION / ASSESSMENT AND PLAN / ED COURSE  Pertinent labs & imaging results that were available during my care of the patient were reviewed by me and considered in my medical decision making (see chart for  details).  Patient well-appearing in no acute distress.  Patient feels that he has a history of asthma but is not sure, he does smoke cigarettes.  I suspect this is more of a COPD exacerbation.  Regardless treated with DuoNeb's, p.o. prednisone with significant improvement.    Patient reports he will be unable to afford prednisone or inhaler Rx, gave Decadron IM in place of prednisone, inhaler provided    ____________________________________________   FINAL CLINICAL IMPRESSION(S) / ED DIAGNOSES  Final diagnoses:  COPD exacerbation (HCC)        Note:  This document was prepared using Dragon voice recognition software and may include unintentional dictation errors.    Jene Every, MD 12/09/17 [phone]

## 2017-12-14 ENCOUNTER — Other Ambulatory Visit: Payer: Self-pay

## 2017-12-14 ENCOUNTER — Emergency Department
Admission: EM | Admit: 2017-12-14 | Discharge: 2017-12-14 | Disposition: A | Discharge status: Home / Self Care | Payer: Medicaid Other | Attending: Emergency Medicine | Admitting: Emergency Medicine

## 2017-12-14 ENCOUNTER — Emergency Department: Payer: Medicaid Other

## 2017-12-14 DIAGNOSIS — R0602 Shortness of breath: Secondary | ICD-10-CM | POA: Diagnosis present

## 2017-12-14 DIAGNOSIS — F17210 Nicotine dependence, cigarettes, uncomplicated: Secondary | ICD-10-CM | POA: Diagnosis not present

## 2017-12-14 DIAGNOSIS — J4541 Moderate persistent asthma with (acute) exacerbation: Secondary | ICD-10-CM | POA: Insufficient documentation

## 2017-12-14 MED ORDER — METHYLPREDNISOLONE SODIUM SUCC 125 MG IJ SOLR
125.0000 mg | Freq: Once | INTRAMUSCULAR | Status: AC
  Administered 2017-12-14: 125 mg via INTRAMUSCULAR
  Filled 2017-12-14: qty 2

## 2017-12-14 MED ORDER — ALBUTEROL SULFATE (2.5 MG/3ML) 0.083% IN NEBU
INHALATION_SOLUTION | RESPIRATORY_TRACT | Status: AC
  Filled 2017-12-14: qty 6

## 2017-12-14 MED ORDER — ALBUTEROL SULFATE (2.5 MG/3ML) 0.083% IN NEBU
5.0000 mg | INHALATION_SOLUTION | Freq: Once | RESPIRATORY_TRACT | Status: AC
  Administered 2017-12-14: 5 mg via RESPIRATORY_TRACT
  Filled 2017-12-14: qty 6

## 2017-12-14 MED ORDER — PREDNISONE 50 MG PO TABS: ORAL_TABLET | ORAL | 0 refills | Status: DC

## 2017-12-14 MED ORDER — IPRATROPIUM BROMIDE 0.02 % IN SOLN
0.5000 mg | Freq: Once | RESPIRATORY_TRACT | Status: AC
  Administered 2017-12-14: 0.5 mg via RESPIRATORY_TRACT
  Filled 2017-12-14: qty 2.5

## 2017-12-14 MED ORDER — ALBUTEROL SULFATE (2.5 MG/3ML) 0.083% IN NEBU
5.0000 mg | INHALATION_SOLUTION | Freq: Once | RESPIRATORY_TRACT | Status: AC
  Administered 2017-12-14: 5 mg via RESPIRATORY_TRACT

## 2017-12-14 MED ORDER — ALBUTEROL SULFATE (2.5 MG/3ML) 0.083% IN NEBU
INHALATION_SOLUTION | RESPIRATORY_TRACT | Status: AC
  Filled 2017-12-14: qty 3

## 2017-12-14 MED ORDER — ALBUTEROL SULFATE HFA 108 (90 BASE) MCG/ACT IN AERS
2.0000 | INHALATION_SPRAY | Freq: Once | RESPIRATORY_TRACT | Status: AC
  Administered 2017-12-14: 2 via RESPIRATORY_TRACT
  Filled 2017-12-14: qty 6.7

## 2017-12-14 NOTE — ED Notes (Signed)
Pt to the er for SOB. Pt was seen here 5 days ago by Dr Cyril LoosenKinner. Dx COPD exacerbation, duonebs, inhaler, and prednisone given. Pt states he is a smoker and is unable to catch his breath. Inhaler is empty already.

## 2017-12-14 NOTE — ED Provider Notes (Signed)
[facility] Emergency Department Provider Note  ____________________________________________  Time seen: Approximately 7:33 PM  I have reviewed the triage vital signs and the nursing notes.   HISTORY  Chief Complaint Asthma    HPI Carlos Maddox is a 47 y.o. male with a history of COPD, presents to the emergency department with shortness of breath and wheezing.  Patient was seen at [facility] 5 days ago for similar symptoms. Patient reports that he was prescribed a steroid but is unable to fill prescription due to expense.  He denies chest tightness, calf pain or worsening pain with inspiration. No fever or chills. No alleviating measures have been attempted.   Past Medical History:  Diagnosis Date  . Asthma     There are no active problems to display for this patient.   Past Surgical History:  Procedure Laterality Date  . PARTIAL HIP ARTHROPLASTY      Prior to Admission medications   Medication Sig Start Date End Date Taking? Authorizing Provider  albuterol (PROVENTIL HFA;VENTOLIN HFA) 108 (90 Base) MCG/ACT inhaler Inhale 2 puffs into the lungs every 6 (six) hours as needed for wheezing or shortness of breath. 07/21/16   Joni Reining, PA-C  predniSONE (DELTASONE) 50 MG tablet Take one 50 mg tablet once daily for the next five days. 12/14/17   Orvil Feil, PA-C    Allergies Patient has no known allergies.  No family history on file.  Social History Social History   Tobacco Use  . Smoking status: Current Every Day Smoker    Types: Cigarettes  . Smokeless tobacco: Never Used  Substance Use Topics  . Alcohol use: Yes  . Drug use: No     Review of Systems  Constitutional: No fever/chills Eyes: No visual changes. No discharge ENT: No upper respiratory complaints. Cardiovascular: no chest pain. Respiratory: Patient has SOB and wheezing.  Gastrointestinal: No abdominal pain.  No nausea, no vomiting.  No diarrhea.   No constipation. Musculoskeletal: Negative for musculoskeletal pain. Skin: Negative for rash, abrasions, lacerations, ecchymosis. Neurological: Negative for headaches, focal weakness or numbness.   ____________________________________________   PHYSICAL EXAM:  VITAL SIGNS: ED Triage Vitals  Enc Vitals Group     BP 12/14/17 1852 (!) 128/91     Pulse Rate 12/14/17 1852 96     Resp 12/14/17 1852 18     Temp 12/14/17 1852 98.7 F (37.1 C)     Temp Source 12/14/17 1852 Oral     SpO2 12/14/17 1852 94 %     Weight 12/14/17 1853 149 lb 14.6 oz (68 kg)     Height 12/14/17 1853 6\' 1"  (1.854 m)     Head Circumference --      Peak Flow --      Pain Score 12/14/17 1853 0     Pain Loc --      Pain Edu? --      Excl. in GC? --      Constitutional: Alert and oriented. Well appearing and in no acute distress. Eyes: Conjunctivae are normal. PERRL. EOMI. Head: Atraumatic. ENT:      Ears: TMs are pearly.      Nose: No congestion/rhinnorhea.      Mouth/Throat: Mucous membranes are moist.  Neck: No stridor.  No cervical spine tenderness to palpation. Cardiovascular: Normal rate, regular rhythm. Normal S1 and S2.  Good peripheral circulation. Respiratory: Diffuse expiratory wheezing.  Diminished breath sounds to the lung bases bilaterally. Gastrointestinal: Bowel sounds 4  quadrants. Soft and nontender to palpation. No guarding or rigidity. No palpable masses. No distention. No CVA tenderness. Musculoskeletal: Full range of motion to all extremities. No gross deformities appreciated. Neurologic:  Normal speech and language. No gross focal neurologic deficits are appreciated.  Skin:  Skin is warm, dry and intact. No rash noted. Psychiatric: Mood and affect are normal. Speech and behavior are normal. Patient exhibits appropriate insight and judgement.   ____________________________________________   LABS (all labs ordered are listed, but only abnormal results are displayed)  Labs  Reviewed - No data to display ____________________________________________  EKG   ____________________________________________  RADIOLOGY I personally viewed and evaluated these images as part of my medical decision making, as well as reviewing the written report by the radiologist.   Dg Chest 2 View  Result Date: 12/14/2017 CLINICAL DATA:  Cough and shortness of breath. EXAM: CHEST - 2 VIEW COMPARISON:  Chest x-ray dated Aug 29, 2016. FINDINGS: The heart size and mediastinal contours are within normal limits. Normal pulmonary vascularity. Central peribronchial thickening. No focal consolidation, pleural effusion, or pneumothorax. No acute osseous abnormality. IMPRESSION: Bronchitic changes, likely smoking-related. Electronically Signed   By: Obie DredgeWilliam T Derry M.D.   On: 12/14/2017 19:52    ____________________________________________    PROCEDURES  Procedure(s) performed:    Procedures    Medications  albuterol (PROVENTIL) (2.5 MG/3ML) 0.083% nebulizer solution 5 mg (5 mg Nebulization Given 12/14/17 1855)  albuterol (PROVENTIL) (2.5 MG/3ML) 0.083% nebulizer solution 5 mg (5 mg Nebulization Given 12/14/17 2018)  ipratropium (ATROVENT) nebulizer solution 0.5 mg (0.5 mg Nebulization Given 12/14/17 1957)  methylPREDNISolone sodium succinate (SOLU-MEDROL) 125 mg/2 mL injection 125 mg (125 mg Intramuscular Given 12/14/17 2001)  albuterol (PROVENTIL) (2.5 MG/3ML) 0.083% nebulizer solution 5 mg (5 mg Nebulization Given 12/14/17 2003)  ipratropium (ATROVENT) nebulizer solution 0.5 mg (0.5 mg Nebulization Given 12/14/17 2005)  albuterol (PROVENTIL HFA;VENTOLIN HFA) 108 (90 Base) MCG/ACT inhaler 2 puff (2 puffs Inhalation Given 12/14/17 2111)     ____________________________________________   INITIAL IMPRESSION / ASSESSMENT AND PLAN / ED COURSE  Pertinent labs & imaging results that were available during my care of the patient were reviewed by me and considered in my medical decision  making (see chart for details).  Review of the [place] CSRS was performed in accordance of the [facility] prior to dispensing any controlled drugs.    Assessment and Plan:  Asthma:  Patient presents to the emergency department with shortness of breath and wheezing.  Patient received 2 breathing treatments in the emergency department as well as an injection of Solu-Medrol.  Wheezing improved significantly with aforementioned breathing treatments.  Patient was given an albuterol inhaler in the emergency department.  He was also given a prescription for prednisone.  Vital signs are reassuring prior to discharge.  All patient questions were answered.    ____________________________________________  FINAL CLINICAL IMPRESSION(S) / ED DIAGNOSES  Final diagnoses:  Moderate persistent asthma with exacerbation      NEW MEDICATIONS STARTED DURING THIS VISIT:  ED Discharge Orders         Ordered    predniSONE (DELTASONE) 50 MG tablet     12/14/17 2046              This chart was dictated using voice recognition software/Dragon. Despite best efforts to proofread, errors can occur which can change the meaning. Any change was purely unintentional.    Carlos Maddox, Harshaan Whang M, PA-C 12/14/17 2126    Sharman CheekStafford, Phillip, MD 12/20/17 Marlyne [id number]

## 2017-12-14 NOTE — ED Triage Notes (Addendum)
Pt to ER via POV c/o wheezing. Pt states same occurred last Thursday. Does not have any breathing tx at home.   Has no income and no insurance.  Able to talk in full and complete sentences. Pt continues to smoke daily.

## 2017-12-20 ENCOUNTER — Other Ambulatory Visit: Payer: Self-pay

## 2017-12-20 ENCOUNTER — Emergency Department
Admission: EM | Admit: 2017-12-20 | Discharge: 2017-12-20 | Disposition: A | Discharge status: Home / Self Care | Payer: Medicaid Other | Attending: Emergency Medicine | Admitting: Emergency Medicine

## 2017-12-20 DIAGNOSIS — J42 Unspecified chronic bronchitis: Secondary | ICD-10-CM | POA: Insufficient documentation

## 2017-12-20 DIAGNOSIS — F17210 Nicotine dependence, cigarettes, uncomplicated: Secondary | ICD-10-CM | POA: Insufficient documentation

## 2017-12-20 DIAGNOSIS — R062 Wheezing: Secondary | ICD-10-CM | POA: Insufficient documentation

## 2017-12-20 DIAGNOSIS — J209 Acute bronchitis, unspecified: Secondary | ICD-10-CM | POA: Diagnosis not present

## 2017-12-20 MED ORDER — ALBUTEROL SULFATE (2.5 MG/3ML) 0.083% IN NEBU
5.0000 mg | INHALATION_SOLUTION | Freq: Once | RESPIRATORY_TRACT | Status: AC
  Administered 2017-12-20: 5 mg via RESPIRATORY_TRACT

## 2017-12-20 MED ORDER — PREDNISONE 10 MG PO TABS: ORAL_TABLET | ORAL | 0 refills | Status: DC

## 2017-12-20 MED ORDER — IPRATROPIUM-ALBUTEROL 0.5-2.5 (3) MG/3ML IN SOLN
3.0000 mL | Freq: Once | RESPIRATORY_TRACT | Status: AC
  Administered 2017-12-20: 3 mL via RESPIRATORY_TRACT
  Filled 2017-12-20: qty 3

## 2017-12-20 MED ORDER — ALBUTEROL SULFATE (2.5 MG/3ML) 0.083% IN NEBU
INHALATION_SOLUTION | RESPIRATORY_TRACT | Status: AC
  Filled 2017-12-20: qty 6

## 2017-12-20 MED ORDER — DEXAMETHASONE SODIUM PHOSPHATE 10 MG/ML IJ SOLN
10.0000 mg | Freq: Once | INTRAMUSCULAR | Status: AC
  Administered 2017-12-20: 10 mg via INTRAMUSCULAR
  Filled 2017-12-20: qty 1

## 2017-12-20 NOTE — ED Notes (Signed)
See triage note  Presents with SOB and wheezing  Was seen for same last week    sxs' became worse last pm   Esp labored and wheezing noted

## 2017-12-20 NOTE — ED Triage Notes (Addendum)
Pt states that he was here last week for asthma attack and SOB. Pt has not filled his albuterol inhaler or steroid due to lack of money.   Denies CP.

## 2017-12-20 NOTE — ED Provider Notes (Signed)
[facility] Emergency Department Provider Note ____________________________________________  Time seen: 1212  I have reviewed the triage vital signs and the nursing notes.  HISTORY  Chief Complaint  Asthma  HPI Carlos Maddox is a 47 y.o. male  Who presents himself to the ED for evaluation of exacerbation of his COPD.  The patient was was evaluated here on 8/27 for complaints of wheezing.  Subsequently discharged with a prescription for prednisone and an albuterol inhaler.  Patient reports that he is uninsured and currently unemployed.  He had no financial means to fill the prescriptions.  He presents now with ongoing symptoms.  He denies any fevers, chills, sweats.  Also denies any chest pain, coughing, or syncope.  Past Medical History:  Diagnosis Date  . Asthma     There are no active problems to display for this patient.   Past Surgical History:  Procedure Laterality Date  . PARTIAL HIP ARTHROPLASTY      Prior to Admission medications   Medication Sig Start Date End Date Taking? Authorizing Provider  albuterol (PROVENTIL HFA;VENTOLIN HFA) 108 (90 Base) MCG/ACT inhaler Inhale 2 puffs into the lungs every 6 (six) hours as needed for wheezing or shortness of breath. 07/21/16   Joni Reining, PA-C  predniSONE (DELTASONE) 10 MG tablet Take 4 tabs day 1 Take 4 tabs day 2 Take 3 tabs day 3 Take 3 tabs day 4 Take 2 tabs day 5  Take 2 tabs day 6 Take 1 tab day 7 12/20/17   Vitalia Stough, Charlesetta Ivory, PA-C    Allergies Patient has no known allergies.  No family history on file.  Social History Social History   Tobacco Use  . Smoking status: Current Every Day Smoker    Types: Cigarettes  . Smokeless tobacco: Never Used  Substance Use Topics  . Alcohol use: Yes  . Drug use: No    Review of Systems  Constitutional: Negative for fever. Eyes: Negative for visual changes. ENT: Negative for sore throat. Cardiovascular: Negative for chest  pain. Respiratory: Positive for shortness of breath and wheezing. Gastrointestinal: Negative for abdominal pain, vomiting and diarrhea. Genitourinary: Negative for dysuria. Musculoskeletal: Negative for back pain. Skin: Negative for rash. Neurological: Negative for headaches, focal weakness or numbness. ____________________________________________  PHYSICAL EXAM:  VITAL SIGNS: ED Triage Vitals  Enc Vitals Group     BP 12/20/17 1135 94/71     Pulse Rate 12/20/17 1135 79     Resp 12/20/17 1135 18     Temp 12/20/17 1135 97.6 F (36.4 C)     Temp Source 12/20/17 1135 Oral     SpO2 12/20/17 1135 96 %     Weight 12/20/17 1136 149 lb 14.6 oz (68 kg)     Height 12/20/17 1136 6\' 1"  (1.854 m)     Head Circumference --      Peak Flow --      Pain Score 12/20/17 1136 0     Pain Loc --      Pain Edu? --      Excl. in GC? --     Constitutional: Alert and oriented. Well appearing and in no distress. Head: Normocephalic and atraumatic. Eyes: Conjunctivae are normal. Normal extraocular movements Cardiovascular: Normal rate, regular rhythm. Normal distal pulses. Respiratory: Normal respiratory effort.  Patient with bilateral wheezing from the apices to bases bilaterally.  No accessory muscle use is noted.  Patient with a prolonged expiratory effort.   Gastrointestinal: Soft and nontender. No distention. Musculoskeletal:  Nontender with normal range of motion in all extremities.  Neurologic:  Normal gait without ataxia. Normal speech and language. No gross focal neurologic deficits are appreciated. ____________________________________________  PROCEDURES  Procedures Albuterol 5 mg nebulizer DuoNeb x 2 Decadron 10 mg IM ____________________________________________  INITIAL IMPRESSION / ASSESSMENT AND PLAN / ED COURSE  Patient with improvement to his wheezing and shortness of breath following an albuterol nebulizer and 2 DuoNeb treatments.  He also received an IM injection of  dexamethasone.  Be discharged with a prescription for prednisone 10 mg and a 7-day taper.  He is encouraged to solicit his friends or family to help with the medications feel.  His return precautions have been reviewed.  He will follow-up with his primary provider or return as needed. ____________________________________________  FINAL CLINICAL IMPRESSION(S) / ED DIAGNOSES  Final diagnoses:  Wheezing  Chronic bronchitis with acute exacerbation (HCC)      Karmen Stabs, Charlesetta Ivory, PA-C 12/20/17 1443    Emily Filbert, MD 12/20/17 [phone]

## 2017-12-20 NOTE — Discharge Instructions (Signed)
You should start the prednisone as directed. Use the albuterol inhaler, if your are able to afford it. Follow-up with your provider or return as needed.

## 2017-12-20 NOTE — ED Notes (Signed)
FIRST NURSE NOTE-here for asthma per pt. Appears somewhat anxious but does not appear labored at this time. ambulatory.

## 2018-01-03 ENCOUNTER — Other Ambulatory Visit: Payer: Self-pay

## 2018-01-03 ENCOUNTER — Inpatient Hospital Stay
Admission: EM | Admit: 2018-01-03 | Discharge: 2018-01-04 | DRG: 190 | Disposition: A | Discharge status: Home / Self Care | Payer: Medicaid Other | Attending: Internal Medicine | Admitting: Internal Medicine

## 2018-01-03 ENCOUNTER — Emergency Department: Payer: Medicaid Other

## 2018-01-03 ENCOUNTER — Encounter: Payer: Self-pay | Admitting: Emergency Medicine

## 2018-01-03 DIAGNOSIS — J9601 Acute respiratory failure with hypoxia: Secondary | ICD-10-CM | POA: Diagnosis present

## 2018-01-03 DIAGNOSIS — J441 Chronic obstructive pulmonary disease with (acute) exacerbation: Secondary | ICD-10-CM | POA: Diagnosis present

## 2018-01-03 DIAGNOSIS — F17210 Nicotine dependence, cigarettes, uncomplicated: Secondary | ICD-10-CM | POA: Diagnosis present

## 2018-01-03 DIAGNOSIS — Z96649 Presence of unspecified artificial hip joint: Secondary | ICD-10-CM | POA: Diagnosis present

## 2018-01-03 DIAGNOSIS — F1210 Cannabis abuse, uncomplicated: Secondary | ICD-10-CM | POA: Diagnosis present

## 2018-01-03 DIAGNOSIS — J4521 Mild intermittent asthma with (acute) exacerbation: Secondary | ICD-10-CM | POA: Diagnosis present

## 2018-01-03 LAB — URINE DRUG SCREEN, QUALITATIVE (ARMC ONLY)
Amphetamines, Ur Screen: NOT DETECTED
Barbiturates, Ur Screen: NOT DETECTED
Benzodiazepine, Ur Scrn: NOT DETECTED
CANNABINOID 50 NG, UR ~~LOC~~: NOT DETECTED
Cocaine Metabolite,Ur ~~LOC~~: POSITIVE — AB
MDMA (Ecstasy)Ur Screen: NOT DETECTED
Methadone Scn, Ur: NOT DETECTED
Opiate, Ur Screen: NOT DETECTED
Phencyclidine (PCP) Ur S: NOT DETECTED
TRICYCLIC, UR SCREEN: NOT DETECTED

## 2018-01-03 LAB — CBC
HCT: 44.8 % (ref 40.0–52.0)
Hemoglobin: 15.2 g/dL (ref 13.0–18.0)
MCH: 33.4 pg (ref 26.0–34.0)
MCHC: 33.9 g/dL (ref 32.0–36.0)
MCV: 98.5 fL (ref 80.0–100.0)
PLATELETS: 175 10*3/uL (ref 150–440)
RBC: 4.55 MIL/uL (ref 4.40–5.90)
RDW: 14.6 % — AB (ref 11.5–14.5)
WBC: 6.5 10*3/uL (ref 3.8–10.6)

## 2018-01-03 LAB — COMPREHENSIVE METABOLIC PANEL
ALBUMIN: 4.2 g/dL (ref 3.5–5.0)
ALT: 12 U/L (ref 0–44)
ANION GAP: 8 (ref 5–15)
AST: 18 U/L (ref 15–41)
Alkaline Phosphatase: 81 U/L (ref 38–126)
BUN: 16 mg/dL (ref 6–20)
CO2: 28 mmol/L (ref 22–32)
Calcium: 9.1 mg/dL (ref 8.9–10.3)
Chloride: 104 mmol/L (ref 98–111)
Creatinine, Ser: 0.97 mg/dL (ref 0.61–1.24)
GFR calc Af Amer: >60 mL/min (ref >60)
GLUCOSE: 104 mg/dL — AB (ref 70–99)
POTASSIUM: 4.1 mmol/L (ref 3.5–5.1)
Sodium: 140 mmol/L (ref 135–145)
TOTAL PROTEIN: 7.5 g/dL (ref 6.5–8.1)
Total Bilirubin: 0.6 mg/dL (ref 0.3–1.2)

## 2018-01-03 LAB — TROPONIN I

## 2018-01-03 LAB — MAGNESIUM: MAGNESIUM: 2.2 mg/dL (ref 1.7–2.4)

## 2018-01-03 MED ORDER — ACETAMINOPHEN 650 MG RE SUPP: 650.0000 mg | Freq: Four times a day (QID) | RECTAL | Status: DC | PRN

## 2018-01-03 MED ORDER — METHYLPREDNISOLONE SODIUM SUCC 125 MG IJ SOLR
125.0000 mg | Freq: Once | INTRAMUSCULAR | Status: AC
Start: 2018-01-03 — End: 2018-01-03
  Administered 2018-01-03: 125 mg via INTRAVENOUS
  Filled 2018-01-03: qty 2

## 2018-01-03 MED ORDER — NICOTINE 14 MG/24HR TD PT24
14.0000 mg | MEDICATED_PATCH | Freq: Every day | TRANSDERMAL | Status: DC
  Administered 2018-01-03 – 2018-01-04 (×2): 14 mg via TRANSDERMAL
  Filled 2018-01-03 (×2): qty 1

## 2018-01-03 MED ORDER — BENZONATATE 100 MG PO CAPS: 100.0000 mg | ORAL_CAPSULE | Freq: Three times a day (TID) | ORAL | Status: DC | PRN

## 2018-01-03 MED ORDER — IPRATROPIUM-ALBUTEROL 0.5-2.5 (3) MG/3ML IN SOLN
RESPIRATORY_TRACT | Status: AC
  Administered 2018-01-03: 3 mL via RESPIRATORY_TRACT
  Filled 2018-01-03: qty 6

## 2018-01-03 MED ORDER — IPRATROPIUM-ALBUTEROL 0.5-2.5 (3) MG/3ML IN SOLN
3.0000 mL | Freq: Once | RESPIRATORY_TRACT | Status: AC
  Administered 2018-01-03: 3 mL via RESPIRATORY_TRACT
  Filled 2018-01-03: qty 3

## 2018-01-03 MED ORDER — ONDANSETRON HCL 4 MG PO TABS: 4.0000 mg | ORAL_TABLET | Freq: Four times a day (QID) | ORAL | Status: DC | PRN

## 2018-01-03 MED ORDER — IPRATROPIUM-ALBUTEROL 0.5-2.5 (3) MG/3ML IN SOLN
3.0000 mL | Freq: Four times a day (QID) | RESPIRATORY_TRACT | Status: DC
  Administered 2018-01-03 – 2018-01-04 (×4): 3 mL via RESPIRATORY_TRACT
  Filled 2018-01-03 (×4): qty 3

## 2018-01-03 MED ORDER — MAGNESIUM SULFATE 2 GM/50ML IV SOLN
2.0000 g | Freq: Once | INTRAVENOUS | Status: AC
  Administered 2018-01-03: 2 g via INTRAVENOUS
  Filled 2018-01-03: qty 50

## 2018-01-03 MED ORDER — SODIUM CHLORIDE 0.9% FLUSH
3.0000 mL | Freq: Two times a day (BID) | INTRAVENOUS | Status: DC
  Administered 2018-01-03 (×2): 3 mL via INTRAVENOUS

## 2018-01-03 MED ORDER — METHYLPREDNISOLONE SODIUM SUCC 40 MG IJ SOLR
40.0000 mg | Freq: Four times a day (QID) | INTRAMUSCULAR | Status: DC
  Administered 2018-01-03 – 2018-01-04 (×4): 40 mg via INTRAVENOUS
  Filled 2018-01-03 (×4): qty 1

## 2018-01-03 MED ORDER — HYDROCODONE-ACETAMINOPHEN 5-325 MG PO TABS: 1.0000 | ORAL_TABLET | ORAL | Status: DC | PRN

## 2018-01-03 MED ORDER — SODIUM CHLORIDE 0.9 % IV SOLN: 250.0000 mL | INTRAVENOUS | Status: DC | PRN

## 2018-01-03 MED ORDER — ONDANSETRON HCL 4 MG/2ML IJ SOLN: 4.0000 mg | Freq: Four times a day (QID) | INTRAMUSCULAR | Status: DC | PRN

## 2018-01-03 MED ORDER — SENNOSIDES-DOCUSATE SODIUM 8.6-50 MG PO TABS: 1.0000 | ORAL_TABLET | Freq: Every evening | ORAL | Status: DC | PRN

## 2018-01-03 MED ORDER — ENOXAPARIN SODIUM 40 MG/0.4ML ~~LOC~~ SOLN
40.0000 mg | SUBCUTANEOUS | Status: DC
  Administered 2018-01-03: 40 mg via SUBCUTANEOUS
  Filled 2018-01-03: qty 0.4

## 2018-01-03 MED ORDER — ALBUTEROL SULFATE (2.5 MG/3ML) 0.083% IN NEBU
2.5000 mg | INHALATION_SOLUTION | RESPIRATORY_TRACT | Status: DC | PRN
  Administered 2018-01-03 – 2018-01-04 (×2): 2.5 mg via RESPIRATORY_TRACT
  Filled 2018-01-03 (×2): qty 3

## 2018-01-03 MED ORDER — ACETAMINOPHEN 325 MG PO TABS: 650.0000 mg | ORAL_TABLET | Freq: Four times a day (QID) | ORAL | Status: DC | PRN

## 2018-01-03 MED ORDER — SODIUM CHLORIDE 0.9% FLUSH: 3.0000 mL | INTRAVENOUS | Status: DC | PRN

## 2018-01-03 MED ORDER — BISACODYL 5 MG PO TBEC: 5.0000 mg | DELAYED_RELEASE_TABLET | Freq: Every day | ORAL | Status: DC | PRN

## 2018-01-03 NOTE — ED Provider Notes (Signed)
[facility] Emergency Department Provider Note   ____________________________________________    I have reviewed the triage vital signs and the nursing notes.   HISTORY  Chief Complaint Asthma     HPI Carlos Maddox is a 47 y.o. male who presents with complaints of shortness of breath.  Patient reports his symptoms started yesterday and greatly worsened overnight.  Reports a history of asthma which may be more COPD.  He describes tightness in his chest but no pain.  No fevers or chills.  No cough.  No pleurisy.  No recent travel.  Has not taken anything for this.  Does smoke cigarettes.  Occasional marijuana.   Past Medical History:  Diagnosis Date  . Asthma     Patient Active Problem List   Diagnosis Date Noted  . COPD exacerbation (HCC) 01/03/2018    Past Surgical History:  Procedure Laterality Date  . PARTIAL HIP ARTHROPLASTY      Prior to Admission medications   Not on File     Allergies Patient has no known allergies.  History reviewed. No pertinent family history.  Social History Social History   Tobacco Use  . Smoking status: Current Every Day Smoker    Types: Cigarettes  . Smokeless tobacco: Never Used  Substance Use Topics  . Alcohol use: Yes  . Drug use: Yes    Types: Marijuana    Comment: last smoked saturday night    Review of Systems  Constitutional: No fever/chills Eyes: No visual changes.  ENT: No sore throat. Cardiovascular: As above Respiratory: As above Gastrointestinal: No abdominal pain.    Genitourinary: Negative for dysuria. Musculoskeletal: Negative for back pain. Skin: Negative for rash. Neurological: Negative for headaches or weakness   ____________________________________________   PHYSICAL EXAM:  VITAL SIGNS: ED Triage Vitals  Enc Vitals Group     BP 01/03/18 0659 125/86     Pulse Rate 01/03/18 0659 77     Resp 01/03/18 0659 (!) 24     Temp 01/03/18 0659 97.8 F (36.6 C)   Temp Source 01/03/18 0659 Oral     SpO2 01/03/18 0659 96 %     Weight 01/03/18 0654 68 kg (150 lb)     Height 01/03/18 0654 1.854 m (6\' 1" )     Head Circumference --      Peak Flow --      Pain Score 01/03/18 0659 10     Pain Loc --      Pain Edu? --      Excl. in GC? --     Constitutional: Alert and oriented.  Sitting on the edge of the bed hands on knees with increased work of breathing Eyes: Conjunctivae are normal.   Nose: No congestion/rhinnorhea. Mouth/Throat: Mucous membranes are moist.    Cardiovascular: Normal rate, regular rhythm. Grossly normal heart sounds.  Good peripheral circulation. Respiratory: Increased work of breathing, sitting on edge of the bed with tachypnea, diffuse wheezing throughout, poor airflow Gastrointestinal: Soft and nontender. No distention.   Musculoskeletal: No lower extremity tenderness nor edema.  Warm and well perfused Neurologic:  Normal speech and language. No gross focal neurologic deficits are appreciated.  Skin:  Skin is warm, dry and intact. No rash noted. Psychiatric: Mood and affect are normal. Speech and behavior are normal.  ____________________________________________   LABS (all labs ordered are listed, but only abnormal results are displayed)  Labs Reviewed  CBC - Abnormal; Notable for the following components:  Result Value   RDW 14.6 (*)    All other components within normal limits  COMPREHENSIVE METABOLIC PANEL - Abnormal; Notable for the following components:   Glucose, Bld 104 (*)    All other components within normal limits  TROPONIN I   ____________________________________________  EKG  ED ECG REPORT I, Jene Everyobert Kira Hartl, the attending physician, personally viewed and interpreted this ECG.  Date: 01/03/2018  Rhythm: normal sinus rhythm QRS Axis: normal Intervals: normal ST/T Wave abnormalities: normal Narrative Interpretation: no evidence of acute  ischemia  ____________________________________________  RADIOLOGY  Chest x-ray negative for pneumonia ____________________________________________   PROCEDURES  Procedure(s) performed: No  Procedures   Critical Care performed: No ____________________________________________   INITIAL IMPRESSION / ASSESSMENT AND PLAN / ED COURSE  Pertinent labs & imaging results that were available during my care of the patient were reviewed by me and considered in my medical decision making (see chart for details).  Patient with significant bronchospasm, likely related to COPD, will give duo nebs, Solu-Medrol, IV magnesium, check labs x-ray carefully monitor  On re-eval patient with continued significant wheezing, requiring oxygen via nasal cannula.  Discussed with the hospitalist for admission    ____________________________________________   FINAL CLINICAL IMPRESSION(S) / ED DIAGNOSES  Final diagnoses:  COPD with acute exacerbation (HCC)        Note:  This document was prepared using Dragon voice recognition software and may include unintentional dictation errors.    Jene EveryKinner, Rickard Kennerly, MD 01/03/18 1049

## 2018-01-03 NOTE — Progress Notes (Addendum)
Received report from admitting nurse that pt had been binge smoking crack cocaine since Friday. MD informed. No verbal orders received.  Pt also verbalized smoking 1-2 cigarettes/day since his funds are low, but is usually a pack a day smoker. He also admitted to drinking occasionally.

## 2018-01-03 NOTE — ED Triage Notes (Signed)
Pt says he started feeling bad last night; history of asthma and is out of his inhaler; long exhalation noted in triage; tightness across his chest; no audible wheezes inspiratory and expiratory wheezes noted on auscultation

## 2018-01-03 NOTE — H&P (Signed)
[facility] [place] [facility]   PATIENT NAME: Carlos Maddox    MR#:  [id number]  DATE OF BIRTH:  07/17/1970  DATE OF ADMISSION:  01/03/2018  PRIMARY CARE PHYSICIAN: Leanna SatoMiles, Linda M, MD   REQUESTING/REFERRING PHYSICIAN: Dr. Cyril LoosenKinner  CHIEF COMPLAINT:   Chief Complaint  Patient presents with  . Asthma   Cough, wheezing and shortness of breath 1 day HISTORY OF PRESENT ILLNESS:  Carlos Maddox  is a 47 y.o. male with a known history of asthma presents the ED with above chief complaints.  He complains of cough, wheezing and shortness of breath for the past 1 day.  He denies any fever or chills, no orthopnea, nocturnal dyspnea or leg edema.  Chest x-ray show chronic bronchitis.  He was found hypoxia and put on oxygen by nasal cannula, treated with IV Solu-Medrol and DuoNeb.  But patient still has wheezing and shortness of breath.  PAST MEDICAL HISTORY:   Past Medical History:  Diagnosis Date  . Asthma     PAST SURGICAL HISTORY:   Past Surgical History:  Procedure Laterality Date  . PARTIAL HIP ARTHROPLASTY      SOCIAL HISTORY:   Social History   Tobacco Use  . Smoking status: Current Every Day Smoker    Types: Cigarettes  . Smokeless tobacco: Never Used  Substance Use Topics  . Alcohol use: Yes    FAMILY HISTORY:  History reviewed. No pertinent family history.   DRUG ALLERGIES:  No Known Allergies  REVIEW OF SYSTEMS:   Review of Systems  Constitutional: Negative for chills, fever and malaise/fatigue.  HENT: Negative for sore throat.   Eyes: Negative for blurred vision and double vision.  Respiratory: Positive for cough, shortness of breath and wheezing. Negative for hemoptysis, sputum production and stridor.   Cardiovascular: Negative for chest pain, palpitations, orthopnea and leg swelling.  Gastrointestinal: Negative for abdominal pain, blood in stool, diarrhea, melena, nausea and vomiting.  Genitourinary: Negative for dysuria, flank  pain and hematuria.  Musculoskeletal: Negative for back pain and joint pain.  Skin: Negative for rash.  Neurological: Negative for dizziness, sensory change, focal weakness, seizures, loss of consciousness, weakness and headaches.  Endo/Heme/Allergies: Negative for polydipsia.  Psychiatric/Behavioral: Negative for depression. The patient is not nervous/anxious.     MEDICATIONS AT HOME:   Prior to Admission medications   Not on File      VITAL SIGNS:  Blood pressure 123/78, pulse 75, temperature 97.8 F (36.6 C), temperature source Oral, resp. rate 17, height 6\' 1"  (1.854 m), weight 68 kg, SpO2 98 %.  PHYSICAL EXAMINATION:  Physical Exam  GENERAL:  47 y.o.-year-old patient lying in the bed with no acute distress.  EYES: Pupils equal, round, reactive to light and accommodation. No scleral icterus. Extraocular muscles intact.  HEENT: Head atraumatic, normocephalic. Oropharynx and nasopharynx clear.  NECK:  Supple, no jugular venous distention. No thyroid enlargement, no tenderness.  LUNGS: Normal breath sounds bilaterally, bilateral expiratory wheezing, no rales,rhonchi or crepitation. No use of accessory muscles of respiration.  CARDIOVASCULAR: S1, S2 normal. No murmurs, rubs, or gallops.  ABDOMEN: Soft, nontender, nondistended. Bowel sounds present. No organomegaly or mass.  EXTREMITIES: No pedal edema, cyanosis, or clubbing.  NEUROLOGIC: Cranial nerves II through XII are intact. Muscle strength 5/5 in all extremities. Sensation intact. Gait not checked.  PSYCHIATRIC: The patient is alert and oriented x 3.  SKIN: No obvious rash, lesion, or ulcer.   LABORATORY PANEL:   CBC Recent Labs  Lab  01/03/18 0722  WBC 6.5  HGB 15.2  HCT 44.8  PLT 175   ------------------------------------------------------------------------------------------------------------------  Chemistries  Recent Labs  Lab 01/03/18 0722  NA 140  K 4.1  CL 104  CO2 28  GLUCOSE 104*  BUN 16    CREATININE 0.97  CALCIUM 9.1  AST 18  ALT 12  ALKPHOS 81  BILITOT 0.6   ------------------------------------------------------------------------------------------------------------------  Cardiac Enzymes Recent Labs  Lab 01/03/18 0722  TROPONINI <0.03   ------------------------------------------------------------------------------------------------------------------  RADIOLOGY:  Dg Chest 2 View  Result Date: 01/03/2018 CLINICAL DATA:  Asthma with shortness of breath EXAM: CHEST - 2 VIEW COMPARISON:  August 14, 2017 FINDINGS: Lungs are somewhat hyperexpanded. There is patchy scarring in the upper lobes bilaterally. There is no edema or consolidation. The heart size and pulmonary vascularity are normal. No adenopathy. No bone lesions. IMPRESSION: Lungs hyperexpanded with patchy areas of scarring in the upper lobes. Suspect a degree of chronic bronchitis. No edema or consolidation. Stable cardiac silhouette. Electronically Signed   By: Bretta Bang III M.D.   On: 01/03/2018 07:57      IMPRESSION AND PLAN:   Acute respiratory failure with hypoxia due to COPD exacerbation. Patient will be admitted to medical floor. Continue start IV Solu-Medrol, DuoNeb every 6 hours. Oxygen by nasal cannula.  Tobacco abuse.  Smoking cessation was counseled for 4 minutes, nicotine patch.    All the records are reviewed and case discussed with ED provider. Management plans discussed with the patient, family and they are in agreement.  CODE STATUS: Full code  TOTAL TIME TAKING CARE OF THIS PATIENT: 45 minutes.    Shaune Pollack M.D on 01/03/2018 at 10:45 AM  Between 7am to 6pm - Pager - [phone]  After 6pm go to [email or web address] - Social research officer, government  [facility] [place] [facility]  Office  [phone]  CC: Primary care physician; Leanna Sato, MD   Note: This dictation was prepared with Dragon dictation along with smaller phrase technology. Any transcriptional errors that  result from this process are unin

## 2018-01-04 MED ORDER — DOXYCYCLINE HYCLATE 100 MG PO CAPS: 100.0000 mg | ORAL_CAPSULE | Freq: Two times a day (BID) | ORAL | 0 refills | Status: AC

## 2018-01-04 MED ORDER — ALBUTEROL SULFATE HFA 108 (90 BASE) MCG/ACT IN AERS: 2.0000 | INHALATION_SPRAY | Freq: Four times a day (QID) | RESPIRATORY_TRACT | 2 refills | Status: DC | PRN

## 2018-01-04 MED ORDER — FLUTICASONE-SALMETEROL 100-50 MCG/DOSE IN AEPB: 1.0000 | INHALATION_SPRAY | Freq: Two times a day (BID) | RESPIRATORY_TRACT | 0 refills | Status: DC

## 2018-01-04 MED ORDER — PREDNISONE 10 MG PO TABS: 50.0000 mg | ORAL_TABLET | Freq: Every day | ORAL | 0 refills | Status: AC

## 2018-01-04 MED ORDER — NICOTINE 14 MG/24HR TD PT24: 14.0000 mg | MEDICATED_PATCH | Freq: Every day | TRANSDERMAL | 0 refills | Status: DC

## 2018-01-04 NOTE — Care Management Note (Signed)
Case Management Note  Patient Details  Name: Carlos Maddox MRN: [id number] Date of Birth: [birth year]  Subjective/Objective:  Patient has multiple recurring ED admissions.  Met with patient at bedside to discuss discharge planning and readmission to there ED. He is uninsured. No PCP. Application given for medication management and [facility]. [facility] to [facility]. No other needs identified.                  Action/Plan:   Expected Discharge Date:  01/04/18               Expected Discharge Plan:  Home/Self Care  In-House Referral:     Discharge planning Services  CM Consult, [facility], Medication Assistance  Post Acute Care Choice:    Choice offered to:     DME Arranged:    DME Agency:     HH Arranged:    HH Agency:     Status of Service:  Completed, signed off  If discussed at [employer], dates discussed:    Additional Comments:  Jolly Mango, RN 01/04/2018, 9:53 AM

## 2018-01-04 NOTE — Discharge Summary (Signed)
[facility] - [place] at [facility]   PATIENT NAME: Carlos Maddox    MR#:  [id number]  DATE OF BIRTH:  06-27-1970  DATE OF ADMISSION:  01/03/2018 ADMITTING PHYSICIAN: Carlos PollackQing Chen, Maddox  DATE OF DISCHARGE: 01/04/2018  PRIMARY CARE PHYSICIAN: Carlos Maddox, Carlos Maddox    ADMISSION DIAGNOSIS:  COPD with acute exacerbation (HCC) [J44.1]  DISCHARGE DIAGNOSIS:  Asthma exacerbation  SECONDARY DIAGNOSIS:   Past Medical History:  Diagnosis Date  . Asthma     HOSPITAL COURSE:   47 year old male with history of tobacco dependence and asthma, mild intermittent who presents to the emergency room due to shortness of breath.  1.  Acute exacerbation of asthma with likely underlying chronic bronchitis/COPD: Patient symptoms have improved.  He will be discharged on prednisone and Doxy. He will also be discharged on albuterol and Advair.  He will need follow-up with his PCP.  2.  Polysubstance abuse: Patient is encouraged to remain abstinent from illicit drugs  3.Tobacco dependence: Patient is encouraged to quit smoking. Counseling was provided for 4 minutes.     DISCHARGE CONDITIONS AND DIET:  Stable Regular diet  CONSULTS OBTAINED:    DRUG ALLERGIES:  No Known Allergies  DISCHARGE MEDICATIONS:   Allergies as of 01/04/2018   No Known Allergies     Medication List    TAKE these medications   albuterol 108 (90 Base) MCG/ACT inhaler Commonly known as:  PROVENTIL HFA;VENTOLIN HFA Inhale 2 puffs into the lungs every 6 (six) hours as needed for wheezing or shortness of breath.   doxycycline 100 MG capsule Commonly known as:  VIBRAMYCIN Take 1 capsule (100 mg total) by mouth 2 (two) times daily for 4 days.   Fluticasone-Salmeterol 100-50 MCG/DOSE Aepb Commonly known as:  ADVAIR Inhale 1 puff into the lungs 2 (two) times daily.   nicotine 14 mg/24hr patch Commonly known as:  NICODERM CQ - dosed in mg/24 hours Place 1 patch (14 mg total) onto the skin daily.    predniSONE 10 MG tablet Commonly known as:  DELTASONE Take 5 tablets (50 mg total) by mouth daily with breakfast for 4 days.         Today   CHIEF COMPLAINT:  Doing well this am   VITAL SIGNS:  Blood pressure 137/87, pulse 82, temperature 97.6 F (36.4 C), resp. rate 14, height 6\' 1"  (1.854 Maddox), weight 62.1 kg, SpO2 97 %.   REVIEW OF SYSTEMS:  Review of Systems  Constitutional: Negative.  Negative for chills, fever and malaise/fatigue.  HENT: Negative.  Negative for ear discharge, ear pain, hearing loss, nosebleeds and sore throat.   Eyes: Negative.  Negative for blurred vision and pain.  Respiratory: Negative.  Negative for cough, hemoptysis, shortness of breath and wheezing.   Cardiovascular: Negative.  Negative for chest pain, palpitations and leg swelling.  Gastrointestinal: Negative.  Negative for abdominal pain, blood in stool, diarrhea, nausea and vomiting.  Genitourinary: Negative.  Negative for dysuria.  Musculoskeletal: Negative.  Negative for back pain.  Skin: Negative.   Neurological: Negative for dizziness, tremors, speech change, focal weakness, seizures and headaches.  Endo/Heme/Allergies: Negative.  Does not bruise/bleed easily.  Psychiatric/Behavioral: Negative.  Negative for depression, hallucinations and suicidal ideas.     PHYSICAL EXAMINATION:  GENERAL:  [age] y.o.-year-old patient lying in the bed with no acute distress.  NECK:  Supple, no jugular venous distention. No thyroid enlargement, no tenderness.  LUNGS: Normal breath sounds bilaterally, no wheezing, rales,rhonchi  No use of accessory muscles of  respiration.  CARDIOVASCULAR: S1, S2 normal. No murmurs, rubs, or gallops.  ABDOMEN: Soft, non-tender, non-distended. Bowel sounds present. No organomegaly or mass.  EXTREMITIES: No pedal edema, cyanosis, or clubbing.  PSYCHIATRIC: The patient is alert and oriented x 3.  SKIN: No obvious rash, lesion, or ulcer.   DATA REVIEW:   CBC Recent Labs   Lab 01/03/18 0722  WBC 6.5  HGB 15.2  HCT 44.8  PLT 175    Chemistries  Recent Labs  Lab 01/03/18 0722  NA 140  K 4.1  CL 104  CO2 28  GLUCOSE 104*  BUN 16  CREATININE 0.97  CALCIUM 9.1  MG 2.2  AST 18  ALT 12  ALKPHOS 81  BILITOT 0.6    Cardiac Enzymes Recent Labs  Lab 01/03/18 0722  TROPONINI <0.03    Microbiology Results  @[email or web address] @  RADIOLOGY:  Dg Chest 2 View  Result Date: 01/03/2018 CLINICAL DATA:  Asthma with shortness of breath EXAM: CHEST - 2 VIEW COMPARISON:  August 14, 2017 FINDINGS: Lungs are somewhat hyperexpanded. There is patchy scarring in the upper lobes bilaterally. There is no edema or consolidation. The heart size and pulmonary vascularity are normal. No adenopathy. No bone lesions. IMPRESSION: Lungs hyperexpanded with patchy areas of scarring in the upper lobes. Suspect a degree of chronic bronchitis. No edema or consolidation. Stable cardiac silhouette. Electronically Signed   By: Bretta Bang III Maddox.D.   On: 01/03/2018 07:57      Allergies as of 01/04/2018   No Known Allergies     Medication List    TAKE these medications   albuterol 108 (90 Base) MCG/ACT inhaler Commonly known as:  PROVENTIL HFA;VENTOLIN HFA Inhale 2 puffs into the lungs every 6 (six) hours as needed for wheezing or shortness of breath.   doxycycline 100 MG capsule Commonly known as:  VIBRAMYCIN Take 1 capsule (100 mg total) by mouth 2 (two) times daily for 4 days.   Fluticasone-Salmeterol 100-50 MCG/DOSE Aepb Commonly known as:  ADVAIR Inhale 1 puff into the lungs 2 (two) times daily.   nicotine 14 mg/24hr patch Commonly known as:  NICODERM CQ - dosed in mg/24 hours Place 1 patch (14 mg total) onto the skin daily.   predniSONE 10 MG tablet Commonly known as:  DELTASONE Take 5 tablets (50 mg total) by mouth daily with breakfast for 4 days.         Management plans discussed with the patient and he is in agreement. Stable for discharge  home  Patient should follow up with pcp  CODE STATUS:     Code Status Orders  (From admission, onward)         Start     Ordered   01/03/18 1104  Full code  Continuous     01/03/18 1103        Code Status History    This patient has a current code status but no historical code status.      TOTAL TIME TAKING CARE OF THIS PATIENT: 38 minutes.    Note: This dictation was prepared with Dragon dictation along with smaller phrase technology. Any transcriptional errors that result from this process are unintentional.  Sunshyne Horvath Maddox.D on 01/04/2018 at 8:17 AM  Between 7am to 6pm - Pager - [phone] After 6pm go to [email or web address] - Social research officer, government  [facility] [place]  Office  [phone]  CC: Primary care physician; Carlos Sato, Maddox

## 2018-01-04 NOTE — Plan of Care (Signed)
Pt is d/ced home.  Breathing has improved.  Discussed cocaine use and how it makes COPD exacerbation worse.  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  Also strongly encouraged him to quit smoking.  Pt acknowledged need to quit.  He requested a breathing tx before he left and this was arranged.  Reviewed f/u appts andnew medications with him - he's [facility]  Nurse tech removed IV.  Pt is going home with his mother.

## 2018-01-05 LAB — HIV ANTIBODY (ROUTINE TESTING W REFLEX): HIV SCREEN 4TH GENERATION: NONREACTIVE

## 2018-02-01 ENCOUNTER — Telehealth: Payer: Self-pay

## 2018-02-01 NOTE — Telephone Encounter (Signed)
Tried calling pt multiple times at [phone] which is mother, Jerrye Beavers, number. Voicemail is full. Unable to leave information that is needed to become a pt at [facility].

## 2018-04-04 ENCOUNTER — Other Ambulatory Visit: Payer: Self-pay

## 2018-04-04 ENCOUNTER — Emergency Department
Admission: EM | Admit: 2018-04-04 | Discharge: 2018-04-04 | Disposition: A | Discharge status: Home / Self Care | Payer: Medicaid Other | Attending: Emergency Medicine | Admitting: Emergency Medicine

## 2018-04-04 DIAGNOSIS — J449 Chronic obstructive pulmonary disease, unspecified: Secondary | ICD-10-CM | POA: Insufficient documentation

## 2018-04-04 DIAGNOSIS — R0602 Shortness of breath: Secondary | ICD-10-CM | POA: Diagnosis present

## 2018-04-04 DIAGNOSIS — J45901 Unspecified asthma with (acute) exacerbation: Secondary | ICD-10-CM | POA: Insufficient documentation

## 2018-04-04 DIAGNOSIS — F17210 Nicotine dependence, cigarettes, uncomplicated: Secondary | ICD-10-CM | POA: Insufficient documentation

## 2018-04-04 DIAGNOSIS — Z79899 Other long term (current) drug therapy: Secondary | ICD-10-CM | POA: Insufficient documentation

## 2018-04-04 MED ORDER — IPRATROPIUM-ALBUTEROL 0.5-2.5 (3) MG/3ML IN SOLN
3.0000 mL | Freq: Once | RESPIRATORY_TRACT | Status: AC
  Administered 2018-04-04: 3 mL via RESPIRATORY_TRACT
  Filled 2018-04-04: qty 3

## 2018-04-04 MED ORDER — PREDNISONE 20 MG PO TABS
60.0000 mg | ORAL_TABLET | Freq: Once | ORAL | Status: AC
  Administered 2018-04-04: 60 mg via ORAL
  Filled 2018-04-04: qty 3

## 2018-04-04 MED ORDER — PREDNISONE 20 MG PO TABS: 60.0000 mg | ORAL_TABLET | Freq: Every day | ORAL | 0 refills | Status: AC

## 2018-04-04 MED ORDER — ALBUTEROL SULFATE (2.5 MG/3ML) 0.083% IN NEBU: 10.0000 mg | INHALATION_SOLUTION | Freq: Once | RESPIRATORY_TRACT | Status: DC

## 2018-04-04 MED ORDER — IPRATROPIUM-ALBUTEROL 0.5-2.5 (3) MG/3ML IN SOLN
RESPIRATORY_TRACT | Status: AC
  Administered 2018-04-04: 3 mL via RESPIRATORY_TRACT
  Filled 2018-04-04: qty 3

## 2018-04-04 MED ORDER — IPRATROPIUM-ALBUTEROL 0.5-2.5 (3) MG/3ML IN SOLN
3.0000 mL | Freq: Once | RESPIRATORY_TRACT | Status: AC
  Administered 2018-04-04: 3 mL via RESPIRATORY_TRACT

## 2018-04-04 MED ORDER — ALBUTEROL SULFATE HFA 108 (90 BASE) MCG/ACT IN AERS: 2.0000 | INHALATION_SPRAY | Freq: Four times a day (QID) | RESPIRATORY_TRACT | 2 refills | Status: DC | PRN

## 2018-04-04 NOTE — ED Triage Notes (Signed)
Patient presents with inspiratory and expiratory wheezing. States he has a history of asthma. Roomed immediately

## 2018-04-04 NOTE — ED Notes (Signed)
Ambulates around nurses station with SAT maintaining 94 and states feels baseline.

## 2018-04-04 NOTE — Discharge Instructions (Addendum)

## 2018-04-04 NOTE — ED Provider Notes (Signed)
[facility] Emergency Department Provider Note  ____________________________________________  Time seen: Approximately 7:56 AM  I have reviewed the triage vital signs and the nursing notes.   HISTORY  Chief Complaint Asthma   HPI Carlos Maddox is a 47 y.o. male with a history of asthma and smoking who presents for evaluation of shortness of breath.  Patient reports a week of progressively worsening shortness of breath and wheezing.  Has been using his inhalers at home.  Patient continues to smoke.  Denies any chest pain, fever, chills.  Patient reports a cough that is intermittently productive of brown sputum however that is chronic for him.  He denies any cocaine use.  Shortness of breath is mild at rest and moderate with minimal exertion.  No prior history of PE or DVT  Past Medical History:  Diagnosis Date  . Asthma     Patient Active Problem List   Diagnosis Date Noted  . COPD exacerbation (HCC) 01/03/2018    Past Surgical History:  Procedure Laterality Date  . PARTIAL HIP ARTHROPLASTY      Prior to Admission medications   Medication Sig Start Date End Date Taking? Authorizing Provider  albuterol (PROVENTIL HFA;VENTOLIN HFA) 108 (90 Base) MCG/ACT inhaler Inhale 2 puffs into the lungs every 6 (six) hours as needed for wheezing or shortness of breath. 04/04/18   Nita Sickle, MD  Fluticasone-Salmeterol (ADVAIR DISKUS) 100-50 MCG/DOSE AEPB Inhale 1 puff into the lungs 2 (two) times daily. Patient not taking: Reported on 04/04/2018 01/04/18 01/04/19  Adrian Saran, MD  nicotine (NICODERM CQ - DOSED IN MG/24 HOURS) 14 mg/24hr patch Place 1 patch (14 mg total) onto the skin daily. Patient not taking: Reported on 04/04/2018 01/04/18   Adrian Saran, MD  predniSONE (DELTASONE) 20 MG tablet Take 3 tablets (60 mg total) by mouth daily for 4 days. 04/04/18 04/08/18  Nita Sickle, MD    Allergies Patient has no known allergies.  No family history  on file.  Social History Social History   Tobacco Use  . Smoking status: Current Every Day Smoker    Types: Cigarettes  . Smokeless tobacco: Never Used  Substance Use Topics  . Alcohol use: Yes  . Drug use: Yes    Types: Marijuana, "Crack" cocaine, Cocaine    Comment: last smoked saturday night    Review of Systems  Constitutional: Negative for fever. Eyes: Negative for visual changes. ENT: Negative for sore throat. Neck: No neck pain  Cardiovascular: Negative for chest pain. Respiratory: + shortness of breath and cough Gastrointestinal: Negative for abdominal pain, vomiting or diarrhea. Genitourinary: Negative for dysuria. Musculoskeletal: Negative for back pain. Skin: Negative for rash. Neurological: Negative for headaches, weakness or numbness. Psych: No SI or HI  ____________________________________________   PHYSICAL EXAM:  VITAL SIGNS: ED Triage Vitals  Enc Vitals Group     BP 04/04/18 0656 (!) 106/52     Pulse Rate 04/04/18 0656 64     Resp 04/04/18 0656 (!) 22     Temp 04/04/18 0656 98 F (36.7 C)     Temp Source 04/04/18 0656 Oral     SpO2 04/04/18 0656 98 %     Weight 04/04/18 0657 145 lb (65.8 kg)     Height 04/04/18 0657 6\' 1"  (1.854 m)     Head Circumference --      Peak Flow --      Pain Score 04/04/18 0657 0     Pain Loc --  Pain Edu? --      Excl. in GC? --     Constitutional: Alert and oriented. Well appearing and in no apparent distress. HEENT:      Head: Normocephalic and atraumatic.         Eyes: Conjunctivae are normal. Sclera is non-icteric.       Mouth/Throat: Mucous membranes are moist.       Neck: Supple with no signs of meningismus. Cardiovascular: Regular rate and rhythm. No murmurs, gallops, or rubs. 2+ symmetrical distal pulses are present in all extremities. No JVD. Respiratory: Mild increased work of breathing, tachypneic, satting well on room air, diffuse wheezes throughout  Gastrointestinal: Soft, non tender, and non  distended with positive bowel sounds. No rebound or guarding. Musculoskeletal: Nontender with normal range of motion in all extremities. No edema, cyanosis, or erythema of extremities. Neurologic: Normal speech and language. Face is symmetric. Moving all extremities. No gross focal neurologic deficits are appreciated. Skin: Skin is warm, dry and intact. No rash noted. Psychiatric: Mood and affect are normal. Speech and behavior are normal.  ____________________________________________   LABS (all labs ordered are listed, but only abnormal results are displayed)  Labs Reviewed - No data to display ____________________________________________  EKG  none  ____________________________________________  RADIOLOGY  none  ____________________________________________   PROCEDURES  Procedure(s) performed: None Procedures Critical Care performed:  None ____________________________________________   INITIAL IMPRESSION / ASSESSMENT AND PLAN / ED COURSE  47 y.o. male with a history of asthma and smoking who presents for evaluation of shortness of breath and wheezing.  Patient is in mild respiratory distress tachypneic but no hypoxia, diffuse expiratory wheezes throughout.  Will give 3 duo nebs and steroids.  With no fever we will not pursue a chest x-ray or flu Maddox at this time.  Patient has no chest pain.  Clinical Course as of Apr 05 951  Mon Apr 04, 2018  [id number] Patient feels markedly improved.  Still with some wheezing but lung sounds are clear.  Ambulated with no difficulty and kept his sats in the mid 90s.  Will discharge home on steroids and albuterol.  Discussed standard return precautions and close follow-up.   [CV]    Clinical Course User Index [CV] Don PerkingVeronese, [place], MD     As part of my medical decision making, I reviewed the following data within the electronic MEDICAL RECORD NUMBER Nursing notes reviewed and incorporated, Old chart reviewed, Notes from prior ED visits and  [place] Controlled Substance Database    Pertinent labs & imaging results that were available during my care of the patient were reviewed by me and considered in my medical decision making (see chart for details).    ____________________________________________   FINAL CLINICAL IMPRESSION(S) / ED DIAGNOSES  Final diagnoses:  Exacerbation of asthma, unspecified asthma severity, unspecified whether persistent      NEW MEDICATIONS STARTED DURING THIS VISIT:  ED Discharge Orders         Ordered    albuterol (PROVENTIL HFA;VENTOLIN HFA) 108 (90 Base) MCG/ACT inhaler  Every 6 hours PRN     04/04/18 0951    predniSONE (DELTASONE) 20 MG tablet  Daily     04/04/18 0951           Note:  This document was prepared using Dragon voice recognition software and may include unintentional dictation errors.    Nita SickleVeronese, , MD 04/04/18 [phone]

## 2018-04-04 NOTE — ED Notes (Signed)
Wheeze decreased. Resp unlabored. States feels better, sitting watching TV.

## 2018-05-03 ENCOUNTER — Emergency Department
Admission: EM | Admit: 2018-05-03 | Discharge: 2018-05-03 | Disposition: A | Discharge status: Home / Self Care | Payer: Medicaid Other | Attending: Emergency Medicine | Admitting: Emergency Medicine

## 2018-05-03 ENCOUNTER — Encounter: Payer: Self-pay | Admitting: Emergency Medicine

## 2018-05-03 ENCOUNTER — Emergency Department: Payer: Medicaid Other

## 2018-05-03 ENCOUNTER — Other Ambulatory Visit: Payer: Self-pay

## 2018-05-03 DIAGNOSIS — J441 Chronic obstructive pulmonary disease with (acute) exacerbation: Secondary | ICD-10-CM | POA: Diagnosis not present

## 2018-05-03 DIAGNOSIS — R0602 Shortness of breath: Secondary | ICD-10-CM | POA: Diagnosis present

## 2018-05-03 DIAGNOSIS — Z96649 Presence of unspecified artificial hip joint: Secondary | ICD-10-CM | POA: Insufficient documentation

## 2018-05-03 DIAGNOSIS — F17210 Nicotine dependence, cigarettes, uncomplicated: Secondary | ICD-10-CM | POA: Diagnosis not present

## 2018-05-03 MED ORDER — PREDNISONE 50 MG PO TABS: 50.0000 mg | ORAL_TABLET | Freq: Every day | ORAL | 0 refills | Status: DC

## 2018-05-03 MED ORDER — IPRATROPIUM-ALBUTEROL 0.5-2.5 (3) MG/3ML IN SOLN
3.0000 mL | Freq: Once | RESPIRATORY_TRACT | Status: AC
  Administered 2018-05-03: 3 mL via RESPIRATORY_TRACT
  Filled 2018-05-03: qty 3

## 2018-05-03 MED ORDER — ALBUTEROL SULFATE (2.5 MG/3ML) 0.083% IN NEBU
5.0000 mg | INHALATION_SOLUTION | Freq: Once | RESPIRATORY_TRACT | Status: AC
  Administered 2018-05-03: 5 mg via RESPIRATORY_TRACT
  Filled 2018-05-03: qty 6

## 2018-05-03 MED ORDER — PREDNISONE 20 MG PO TABS
60.0000 mg | ORAL_TABLET | Freq: Once | ORAL | Status: AC
  Administered 2018-05-03: 60 mg via ORAL
  Filled 2018-05-03: qty 3

## 2018-05-03 NOTE — ED Triage Notes (Signed)
PT c/o asthma exacerbation w/ worsening SOB x2 days. Pt 97% on RA with tahcpnea noted.

## 2018-05-03 NOTE — ED Provider Notes (Signed)
[facility] Emergency Department Provider Note   ____________________________________________    I have reviewed the triage vital signs and the nursing notes.   HISTORY  Chief Complaint Asthma     HPI Carlos Maddox is a 48 y.o. male who presents with complaints of wheezing and shortness of breath.  Patient reports a history of asthma versus COPD.  He notes over the last 2 days gradually worsening chest tightness, dry cough and shortness of breath.  He thinks he has an upper respiratory infection.  No recent travel.  No leg pain or swelling.  Has not take anything for this.  Review of records demonstrates prior ED visits for similar complaints   Past Medical History:  Diagnosis Date  . Asthma     Patient Active Problem List   Diagnosis Date Noted  . COPD exacerbation (HCC) 01/03/2018    Past Surgical History:  Procedure Laterality Date  . PARTIAL HIP ARTHROPLASTY      Prior to Admission medications   Medication Sig Start Date End Date Taking? Authorizing Provider  albuterol (PROVENTIL HFA;VENTOLIN HFA) 108 (90 Base) MCG/ACT inhaler Inhale 2 puffs into the lungs every 6 (six) hours as needed for wheezing or shortness of breath. 04/04/18   Nita Sickle, MD  Fluticasone-Salmeterol (ADVAIR DISKUS) 100-50 MCG/DOSE AEPB Inhale 1 puff into the lungs 2 (two) times daily. Patient not taking: Reported on 04/04/2018 01/04/18 01/04/19  Adrian Saran, MD  nicotine (NICODERM CQ - DOSED IN MG/24 HOURS) 14 mg/24hr patch Place 1 patch (14 mg total) onto the skin daily. Patient not taking: Reported on 04/04/2018 01/04/18   Adrian Saran, MD  predniSONE (DELTASONE) 50 MG tablet Take 1 tablet (50 mg total) by mouth daily with breakfast. 05/03/18   Jene Every, MD     Allergies Patient has no known allergies.  No family history on file.  Social History Social History   Tobacco Use  . Smoking status: Current Every Day Smoker    Types: Cigarettes  .  Smokeless tobacco: Never Used  Substance Use Topics  . Alcohol use: Yes  . Drug use: Yes    Types: Marijuana, "Crack" cocaine, Cocaine    Comment: last smoked saturday night    Review of Systems  Constitutional: No fever/chills Eyes: No visual changes.  ENT: No throat swelling Cardiovascular: Mild chest tightness Respiratory: As above Gastrointestinal: No abdominal pain.  No nausea, no vomiting.   Genitourinary: Negative for dysuria. Musculoskeletal: Negative for back pain. Skin: Negative for rash. Neurological: Negative for headaches    ____________________________________________   PHYSICAL EXAM:  VITAL SIGNS: ED Triage Vitals  Enc Vitals Group     BP 05/03/18 1359 (!) 116/54     Pulse Rate 05/03/18 1359 81     Resp 05/03/18 1359 (!) 24     Temp 05/03/18 1359 (!) 96 F (35.6 C)     Temp Source 05/03/18 1359 Oral     SpO2 05/03/18 1359 98 %     Weight --      Height --      Head Circumference --      Peak Flow --      Pain Score 05/03/18 1400 0     Pain Loc --      Pain Edu? --      Excl. in GC? --     Constitutional: Alert and oriented. Eyes: Conjunctivae are normal.  Nose: No congestion/rhinnorhea. Mouth/Throat: Mucous membranes are moist.   Neck:  Painless ROM  Cardiovascular: Normal rate, regular rhythm. Grossly normal heart sounds.  Good peripheral circulation. Respiratory: Mild tachypnea.  No retractions.  Scattered wheezes. Gastrointestinal: Soft and nontender. No distention.  Musculoskeletal: No lower extremity tenderness nor edema.  Warm and well perfused Neurologic:  Normal speech and language. No gross focal neurologic deficits are appreciated.  Skin:  Skin is warm, dry and intact. No rash noted. Psychiatric: Mood and affect are normal. Speech and behavior are normal.  ____________________________________________   LABS (all labs ordered are listed, but only abnormal results are displayed)  Labs Reviewed - No data to  display ____________________________________________  EKG  None ____________________________________________  RADIOLOGY  Chest x-ray negative for pneumonia ____________________________________________   PROCEDURES  Procedure(s) performed: No  Procedures   Critical Care performed: No ____________________________________________   INITIAL IMPRESSION / ASSESSMENT AND PLAN / ED COURSE  Pertinent labs & imaging results that were available during my care of the patient were reviewed by me and considered in my medical decision making (see chart for details).  Patient presents with mild asthma versus COPD exacerbation.  We will treat with duo nebs, prednisone and reevaluate.  ----------------------------------------- 4:11 PM on 05/03/2018 -----------------------------------------  Patient much improved after treatment.  He is pacing back and forth in the doorway of his room anxious to leave.  Reexam is quite reassuring, appropriate for discharge and close follow-up, strict return precautions discussed    ____________________________________________   FINAL CLINICAL IMPRESSION(S) / ED DIAGNOSES  Final diagnoses:  COPD exacerbation (HCC)        Note:  This document was prepared using Dragon voice recognition software and may include unintentional dictation errors.   Jene EveryKinner, Caia Lofaro, MD 05/03/18 [phone]

## 2018-05-03 NOTE — ED Notes (Signed)
Pt resting in bed, med given, breathing tx initiated per order, will continue to monitor pt. Warm blanket given.

## 2018-05-27 ENCOUNTER — Emergency Department: Payer: Medicaid Other

## 2018-05-27 ENCOUNTER — Encounter: Payer: Self-pay | Admitting: Emergency Medicine

## 2018-05-27 ENCOUNTER — Other Ambulatory Visit: Payer: Self-pay

## 2018-05-27 ENCOUNTER — Inpatient Hospital Stay
Admission: EM | Admit: 2018-05-27 | Discharge: 2018-05-29 | DRG: 202 | Disposition: A | Discharge status: Home / Self Care | Payer: Medicaid Other | Attending: Internal Medicine | Admitting: Internal Medicine

## 2018-05-27 DIAGNOSIS — Z9114 Patient's other noncompliance with medication regimen: Secondary | ICD-10-CM

## 2018-05-27 DIAGNOSIS — J9601 Acute respiratory failure with hypoxia: Secondary | ICD-10-CM | POA: Diagnosis present

## 2018-05-27 DIAGNOSIS — Z96649 Presence of unspecified artificial hip joint: Secondary | ICD-10-CM | POA: Diagnosis present

## 2018-05-27 DIAGNOSIS — Z79899 Other long term (current) drug therapy: Secondary | ICD-10-CM

## 2018-05-27 DIAGNOSIS — J449 Chronic obstructive pulmonary disease, unspecified: Secondary | ICD-10-CM | POA: Diagnosis present

## 2018-05-27 DIAGNOSIS — J45901 Unspecified asthma with (acute) exacerbation: Secondary | ICD-10-CM

## 2018-05-27 DIAGNOSIS — Z8249 Family history of ischemic heart disease and other diseases of the circulatory system: Secondary | ICD-10-CM | POA: Diagnosis not present

## 2018-05-27 DIAGNOSIS — Z7951 Long term (current) use of inhaled steroids: Secondary | ICD-10-CM | POA: Diagnosis not present

## 2018-05-27 DIAGNOSIS — Z7952 Long term (current) use of systemic steroids: Secondary | ICD-10-CM | POA: Diagnosis not present

## 2018-05-27 DIAGNOSIS — F17210 Nicotine dependence, cigarettes, uncomplicated: Secondary | ICD-10-CM | POA: Diagnosis present

## 2018-05-27 DIAGNOSIS — J101 Influenza due to other identified influenza virus with other respiratory manifestations: Secondary | ICD-10-CM | POA: Diagnosis present

## 2018-05-27 LAB — BASIC METABOLIC PANEL
ANION GAP: 10 (ref 5–15)
BUN: 13 mg/dL (ref 6–20)
CO2: 22 mmol/L (ref 22–32)
Calcium: 8.6 mg/dL — ABNORMAL LOW (ref 8.9–10.3)
Chloride: 101 mmol/L (ref 98–111)
Creatinine, Ser: 1.1 mg/dL (ref 0.61–1.24)
GFR calc Af Amer: >60 mL/min (ref >60)
GFR calc non Af Amer: >60 mL/min (ref >60)
Glucose, Bld: 139 mg/dL — ABNORMAL HIGH (ref 70–99)
POTASSIUM: 3.7 mmol/L (ref 3.5–5.1)
Sodium: 133 mmol/L — ABNORMAL LOW (ref 135–145)

## 2018-05-27 LAB — CBC WITH DIFFERENTIAL/PLATELET
Abs Immature Granulocytes: 0.04 10*3/uL (ref 0.00–0.07)
Basophils Absolute: 0 10*3/uL (ref 0.0–0.1)
Basophils Relative: 0 %
Eosinophils Absolute: 0 10*3/uL (ref 0.0–0.5)
Eosinophils Relative: 0 %
HCT: 40.8 % (ref 39.0–52.0)
Hemoglobin: 13.6 g/dL (ref 13.0–17.0)
IMMATURE GRANULOCYTES: 1 %
Lymphocytes Relative: 6 %
Lymphs Abs: 0.4 10*3/uL — ABNORMAL LOW (ref 0.7–4.0)
MCH: 31.6 pg (ref 26.0–34.0)
MCHC: 33.3 g/dL (ref 30.0–36.0)
MCV: 94.9 fL (ref 80.0–100.0)
Monocytes Absolute: 0.4 10*3/uL (ref 0.1–1.0)
Monocytes Relative: 7 %
Neutro Abs: 5 10*3/uL (ref 1.7–7.7)
Neutrophils Relative %: 86 %
PLATELETS: 147 10*3/uL — AB (ref 150–400)
RBC: 4.3 MIL/uL (ref 4.22–5.81)
RDW: 12.9 % (ref 11.5–15.5)
WBC: 5.8 10*3/uL (ref 4.0–10.5)
nRBC: 0 % (ref 0.0–0.2)

## 2018-05-27 LAB — TROPONIN I

## 2018-05-27 LAB — BRAIN NATRIURETIC PEPTIDE: B Natriuretic Peptide: 22 pg/mL (ref 0.0–100.0)

## 2018-05-27 LAB — INFLUENZA PANEL BY PCR (TYPE A & B)
Influenza A By PCR: POSITIVE — AB
Influenza B By PCR: NEGATIVE

## 2018-05-27 MED ORDER — TIOTROPIUM BROMIDE MONOHYDRATE 18 MCG IN CAPS
18.0000 ug | ORAL_CAPSULE | Freq: Every day | RESPIRATORY_TRACT | Status: DC
  Administered 2018-05-27 – 2018-05-29 (×3): 18 ug via RESPIRATORY_TRACT
  Filled 2018-05-27: qty 5

## 2018-05-27 MED ORDER — LORAZEPAM 1 MG PO TABS
1.0000 mg | ORAL_TABLET | Freq: Once | ORAL | Status: AC
  Administered 2018-05-27: 1 mg via ORAL
  Filled 2018-05-27: qty 1

## 2018-05-27 MED ORDER — OSELTAMIVIR PHOSPHATE 75 MG PO CAPS
75.0000 mg | ORAL_CAPSULE | Freq: Two times a day (BID) | ORAL | Status: DC
  Administered 2018-05-27 – 2018-05-29 (×4): 75 mg via ORAL
  Filled 2018-05-27 (×4): qty 1

## 2018-05-27 MED ORDER — IPRATROPIUM-ALBUTEROL 0.5-2.5 (3) MG/3ML IN SOLN
3.0000 mL | Freq: Four times a day (QID) | RESPIRATORY_TRACT | Status: DC
  Administered 2018-05-28 – 2018-05-29 (×5): 3 mL via RESPIRATORY_TRACT
  Filled 2018-05-27 (×8): qty 3

## 2018-05-27 MED ORDER — DOCUSATE SODIUM 100 MG PO CAPS: 100.0000 mg | ORAL_CAPSULE | Freq: Two times a day (BID) | ORAL | Status: DC | PRN

## 2018-05-27 MED ORDER — BUDESONIDE 0.25 MG/2ML IN SUSP
0.2500 mg | Freq: Two times a day (BID) | RESPIRATORY_TRACT | Status: DC
  Administered 2018-05-27 – 2018-05-29 (×4): 0.25 mg via RESPIRATORY_TRACT
  Filled 2018-05-27 (×5): qty 2

## 2018-05-27 MED ORDER — IPRATROPIUM-ALBUTEROL 0.5-2.5 (3) MG/3ML IN SOLN
3.0000 mL | Freq: Once | RESPIRATORY_TRACT | Status: AC
  Administered 2018-05-27: 3 mL via RESPIRATORY_TRACT
  Filled 2018-05-27: qty 3

## 2018-05-27 MED ORDER — ACETAMINOPHEN 325 MG PO TABS
650.0000 mg | ORAL_TABLET | Freq: Four times a day (QID) | ORAL | Status: DC | PRN
  Administered 2018-05-28: 650 mg via ORAL
  Filled 2018-05-27: qty 2

## 2018-05-27 MED ORDER — NICOTINE 14 MG/24HR TD PT24
14.0000 mg | MEDICATED_PATCH | Freq: Every day | TRANSDERMAL | Status: DC
  Administered 2018-05-27 – 2018-05-29 (×3): 14 mg via TRANSDERMAL
  Filled 2018-05-27 (×3): qty 1

## 2018-05-27 MED ORDER — ALBUTEROL SULFATE (2.5 MG/3ML) 0.083% IN NEBU
INHALATION_SOLUTION | RESPIRATORY_TRACT | Status: AC
  Filled 2018-05-27: qty 6

## 2018-05-27 MED ORDER — METHYLPREDNISOLONE SODIUM SUCC 125 MG IJ SOLR
60.0000 mg | Freq: Four times a day (QID) | INTRAMUSCULAR | Status: DC
  Administered 2018-05-27 – 2018-05-29 (×8): 60 mg via INTRAVENOUS
  Filled 2018-05-27 (×8): qty 2

## 2018-05-27 MED ORDER — HEPARIN SODIUM (PORCINE) 5000 UNIT/ML IJ SOLN
5000.0000 [IU] | Freq: Three times a day (TID) | INTRAMUSCULAR | Status: DC
  Administered 2018-05-27 – 2018-05-29 (×6): 5000 [IU] via SUBCUTANEOUS
  Filled 2018-05-27 (×7): qty 1

## 2018-05-27 MED ORDER — OSELTAMIVIR PHOSPHATE 75 MG PO CAPS
75.0000 mg | ORAL_CAPSULE | Freq: Once | ORAL | Status: AC
  Administered 2018-05-27: 75 mg via ORAL
  Filled 2018-05-27: qty 1

## 2018-05-27 MED ORDER — ALBUTEROL SULFATE (2.5 MG/3ML) 0.083% IN NEBU
5.0000 mg | INHALATION_SOLUTION | Freq: Once | RESPIRATORY_TRACT | Status: AC
  Administered 2018-05-27: 5 mg via RESPIRATORY_TRACT

## 2018-05-27 MED ORDER — PREDNISONE 20 MG PO TABS
60.0000 mg | ORAL_TABLET | Freq: Once | ORAL | Status: AC
  Administered 2018-05-27: 60 mg via ORAL
  Filled 2018-05-27: qty 3

## 2018-05-27 MED ORDER — IPRATROPIUM-ALBUTEROL 0.5-2.5 (3) MG/3ML IN SOLN
3.0000 mL | RESPIRATORY_TRACT | Status: DC
  Administered 2018-05-27: 3 mL via RESPIRATORY_TRACT
  Filled 2018-05-27: qty 3

## 2018-05-27 NOTE — ED Notes (Signed)
Meal tray given to pt. Remains mildly labored with exertion. Remains on nasal cannula.  No other needs.

## 2018-05-27 NOTE — ED Notes (Signed)
Meal tray ordered 

## 2018-05-27 NOTE — ED Notes (Signed)
Patient back from xray. Oxygen saturation at 83% on room air. Placed on 3L [place] with rise to 91%. MD at bedside.

## 2018-05-27 NOTE — H&P (Signed)
[facility] - [place] [facility]   PATIENT NAME: Carlos Maddox    MR#:  [id number]  DATE OF BIRTH:  February 18, 1971  DATE OF ADMISSION:  05/27/2018  PRIMARY CARE PHYSICIAN: Leanna Sato, MD   REQUESTING/REFERRING PHYSICIAN: Mayford Knife  CHIEF COMPLAINT:   Chief Complaint  Patient presents with  . Asthma    HISTORY OF PRESENT ILLNESS: Carlos Maddox  is a 48 y.o. male with a known history of asthma, noncompliant with medications or doctors follow-up-since yesterday having cough without sputum and shortness of breath.  He also have chest pain due to significant coughing and shortness of breath and came to emergency room. Noted to have clear chest x-ray but influenza A and hypoxia, requiring supplemental oxygen in ER so suggested to admit.  PAST MEDICAL HISTORY:   Past Medical History:  Diagnosis Date  . Asthma     PAST SURGICAL HISTORY:  Past Surgical History:  Procedure Laterality Date  . PARTIAL HIP ARTHROPLASTY      SOCIAL HISTORY:  Social History   Tobacco Use  . Smoking status: Current Every Day Smoker    Types: Cigarettes  . Smokeless tobacco: Never Used  Substance Use Topics  . Alcohol use: Yes    FAMILY HISTORY:  Family History  Problem Relation Age of Onset  . Hypertension Mother     DRUG ALLERGIES: No Known Allergies  REVIEW OF SYSTEMS:   CONSTITUTIONAL: No fever, fatigue or weakness.  EYES: No blurred or double vision.  EARS, NOSE, AND THROAT: No tinnitus or ear pain.  RESPIRATORY: Have cough, shortness of breath, wheezing, no hemoptysis.  CARDIOVASCULAR: No chest pain, orthopnea, edema.  GASTROINTESTINAL: No nausea, vomiting, diarrhea or abdominal pain.  GENITOURINARY: No dysuria, hematuria.  ENDOCRINE: No polyuria, nocturia,  HEMATOLOGY: No anemia, easy bruising or bleeding SKIN: No rash or lesion. MUSCULOSKELETAL: No joint pain or arthritis.   NEUROLOGIC: No tingling, numbness, weakness.  PSYCHIATRY: No anxiety or depression.    MEDICATIONS AT HOME:  Prior to Admission medications   Medication Sig Start Date End Date Taking? Authorizing Provider  albuterol (PROVENTIL HFA;VENTOLIN HFA) 108 (90 Base) MCG/ACT inhaler Inhale 2 puffs into the lungs every 6 (six) hours as needed for wheezing or shortness of breath. 04/04/18   Nita Sickle, MD  Fluticasone-Salmeterol (ADVAIR DISKUS) 100-50 MCG/DOSE AEPB Inhale 1 puff into the lungs 2 (two) times daily. Patient not taking: Reported on 04/04/2018 01/04/18 01/04/19  Adrian Saran, MD  nicotine (NICODERM CQ - DOSED IN MG/24 HOURS) 14 mg/24hr patch Place 1 patch (14 mg total) onto the skin daily. Patient not taking: Reported on 04/04/2018 01/04/18   Adrian Saran, MD  predniSONE (DELTASONE) 50 MG tablet Take 1 tablet (50 mg total) by mouth daily with breakfast. 05/03/18   Jene Every, MD      PHYSICAL EXAMINATION:   VITAL SIGNS: Blood pressure 115/60, pulse 99, temperature 99.4 F (37.4 C), temperature source Oral, resp. rate (!) 27, height 6\' 1"  (1.854 m), weight 65.8 kg, SpO2 92 %.  GENERAL:  48 y.o.-year-old patient lying in the bed with no acute distress.  EYES: Pupils equal, round, reactive to light and accommodation. No scleral icterus. Extraocular muscles intact.  HEENT: Head atraumatic, normocephalic. Oropharynx and nasopharynx clear.  NECK:  Supple, no jugular venous distention. No thyroid enlargement, no tenderness.  LUNGS: Normal breath sounds bilaterally, some wheezing, no crepitation. No use of accessory muscles of respiration.  CARDIOVASCULAR: S1, S2 normal. No murmurs, rubs, or gallops.  ABDOMEN: Soft, nontender, nondistended.  Bowel sounds present. No organomegaly or mass.  EXTREMITIES: No pedal edema, cyanosis, or clubbing.  NEUROLOGIC: Cranial nerves II through XII are intact. Muscle strength 5/5 in all extremities. Sensation intact. Gait not checked.  PSYCHIATRIC: The patient is alert and oriented x 3.  SKIN: No obvious rash, lesion, or ulcer.    LABORATORY PANEL:   CBC Recent Labs  Lab 05/27/18 1006  WBC 5.8  HGB 13.6  HCT 40.8  PLT 147*  MCV 94.9  MCH 31.6  MCHC 33.3  RDW 12.9  LYMPHSABS 0.4*  MONOABS 0.4  EOSABS 0.0  BASOSABS 0.0   ------------------------------------------------------------------------------------------------------------------  Chemistries  Recent Labs  Lab 05/27/18 1006  NA 133*  K 3.7  CL 101  CO2 22  GLUCOSE 139*  BUN 13  CREATININE 1.10  CALCIUM 8.6*   ------------------------------------------------------------------------------------------------------------------ estimated creatinine clearance is 78.1 mL/min (by C-G formula based on SCr of 1.1 mg/dL). ------------------------------------------------------------------------------------------------------------------ No results for input(s): TSH, T4TOTAL, T3FREE, THYROIDAB in the last 72 hours.  Invalid input(s): FREET3   Coagulation profile No results for input(s): INR, PROTIME in the last 168 hours. ------------------------------------------------------------------------------------------------------------------- No results for input(s): DDIMER in the last 72 hours. -------------------------------------------------------------------------------------------------------------------  Cardiac Enzymes Recent Labs  Lab 05/27/18 1006  TROPONINI <0.03   ------------------------------------------------------------------------------------------------------------------ Invalid input(s): POCBNP  ---------------------------------------------------------------------------------------------------------------  Urinalysis No results found for: COLORURINE, APPEARANCEUR, LABSPEC, PHURINE, GLUCOSEU, HGBUR, BILIRUBINUR, KETONESUR, PROTEINUR, UROBILINOGEN, NITRITE, LEUKOCYTESUR   RADIOLOGY: Dg Chest 2 View  Result Date: 05/27/2018 CLINICAL DATA:  Chest pain with shortness of breath and wheezing EXAM: CHEST - 2 VIEW COMPARISON:  May 03, 2018 FINDINGS: There is no edema or consolidation. The heart size and pulmonary vascularity are normal. No adenopathy. No bone lesions. No pneumothorax. IMPRESSION: No edema or consolidation. Electronically Signed   By: Bretta BangWilliam  Woodruff III M.D.   On: 05/27/2018 09:36    EKG: Orders placed or performed during the hospital encounter of 05/27/18  . ED EKG  . ED EKG  . EKG 12-Lead  . EKG 12-Lead    IMPRESSION AND PLAN:  *Acute respiratory failure with hypoxia Asthma exacerbation Secondary to influenza A  IV and inhaled steroid Nebulizer treatment, Spiriva, supplemental oxygen No need for antibiotics for now. Tamiflu.  *Active smoking Counseled to quit smoking for 4 minutes and offered nicotine patch.  All the records are reviewed and case discussed with ED provider. Management plans discussed with the patient, family and they are in agreement.  CODE STATUS: Full code Code Status History    Date Active Date Inactive Code Status Order ID Comments User Context   01/03/2018 1103 01/04/2018 1550 Full Code [id number]  Shaune Pollackhen, Qing, MD Inpatient       TOTAL TIME TAKING CARE OF THIS PATIENT: 45 minutes.    Altamese DillingVaibhavkumar Judd Mccubbin M.D on 05/27/2018   Between 7am to 6pm - Pager - [phone]  After 6pm go to [email or web address] - password [email or web address]  [facility]  Office  [phone]  CC: Primary care physician; Leanna SatoMiles, Linda M, MD   Note: This dictation was prepared with Dragon dictation along with smaller phrase technology. Any transcriptional errors that result from this process are unintentional.

## 2018-05-27 NOTE — ED Notes (Signed)
ED TO INPATIENT HANDOFF REPORT  Name/Age/Gender Armanda Heritage 48 y.o. male  Code Status    Code Status Orders  (From admission, onward)         Start     Ordered   05/27/18 1224  Full code  Continuous     05/27/18 1223        Code Status History    Date Active Date Inactive Code Status Order ID Comments User Context   01/03/2018 1103 01/04/2018 1550 Full Code [id number]  Shaune Pollack, MD Inpatient      Home/SNF/Other Home  Chief Complaint Athma attack  Level of Care/Admitting Diagnosis ED Disposition    ED Disposition Condition Comment   Admit  Hospital Area: [facility] [[id number]]  Level of Care: Med-Surg [16]  Diagnosis: Acute respiratory failure with hypoxia [facility] [[id number]]  Admitting Physician: Altamese Dilling [phone]  Attending Physician: Altamese Dilling [phone]  Estimated length of stay: past midnight tomorrow  Certification:: I certify this patient will need inpatient services for at least 2 midnights  PT Class (Do Not Modify): Inpatient [101]  PT Acc Code (Do Not Modify): Private [1]       Medical History Past Medical History:  Diagnosis Date  . Asthma     Allergies No Known Allergies  IV Location/Drains/Wounds Patient Lines/Drains/Airways Status   Active Line/Drains/Airways    Name:   Placement date:   Placement time:   Site:   Days:   Peripheral IV 05/27/18 Right Forearm   05/27/18    1028    Forearm   less than 1          Labs/Imaging Results for orders placed or performed during the hospital encounter of 05/27/18 (from the past 48 hour(s))  CBC with Differential     Status: Abnormal   Collection Time: 05/27/18 10:06 AM  Result Value Ref Range   WBC 5.8 4.0 - 10.5 K/uL   RBC 4.30 4.22 - 5.81 MIL/uL   Hemoglobin 13.6 13.0 - 17.0 g/dL   HCT 83.0 94.0 - 76.8 %   MCV 94.9 80.0 - 100.0 fL   MCH 31.6 26.0 - 34.0 pg   MCHC 33.3 30.0 - 36.0 g/dL   RDW 08.8 11.0 - 31.5 %   Platelets 147 (L) 150 - 400  K/uL   nRBC 0.0 0.0 - 0.2 %   Neutrophils Relative % 86 %   Neutro Abs 5.0 1.7 - 7.7 K/uL   Lymphocytes Relative 6 %   Lymphs Abs 0.4 (L) 0.7 - 4.0 K/uL   Monocytes Relative 7 %   Monocytes Absolute 0.4 0.1 - 1.0 K/uL   Eosinophils Relative 0 %   Eosinophils Absolute 0.0 0.0 - 0.5 K/uL   Basophils Relative 0 %   Basophils Absolute 0.0 0.0 - 0.1 K/uL   Immature Granulocytes 1 %   Abs Immature Granulocytes 0.04 0.00 - 0.07 K/uL    Comment: Performed at [facility], [address] [place], [place] [address]  Basic metabolic panel     Status: Abnormal   Collection Time: 05/27/18 10:06 AM  Result Value Ref Range   Sodium 133 (L) 135 - 145 mmol/L   Potassium 3.7 3.5 - 5.1 mmol/L   Chloride 101 98 - 111 mmol/L   CO2 22 22 - 32 mmol/L   Glucose, Bld 139 (H) 70 - 99 mg/dL   BUN 13 6 - 20 mg/dL   Creatinine, Ser 9.29 0.61 - 1.24 mg/dL   Calcium 8.6 (L)  8.9 - 10.3 mg/dL   GFR calc non Af Amer >60 >60 mL/min   GFR calc Af Amer >60 >60 mL/min   Anion gap 10 5 - 15    Comment: Performed at [facility], [address] [place], [place] [address]  Troponin I - Once     Status: None   Collection Time: 05/27/18 10:06 AM  Result Value Ref Range   Troponin I <0.03 <0.03 ng/mL    Comment: Performed at [facility], [address] [place], [place] [address]  Brain natriuretic peptide     Status: None   Collection Time: 05/27/18 10:07 AM  Result Value Ref Range   B Natriuretic Peptide 22.0 0.0 - 100.0 pg/mL    Comment: Performed at [facility], [address] [place], [place] [address]  Influenza panel by PCR (type A & B)     Status: Abnormal   Collection Time: 05/27/18 10:07 AM  Result Value Ref Range   Influenza A By PCR POSITIVE (A) NEGATIVE   Influenza B By PCR NEGATIVE NEGATIVE    Comment: (NOTE) The Xpert Xpress Flu assay is intended as an aid in the diagnosis of  influenza and should not be used as a sole basis for treatment.  This  assay  is FDA approved for nasopharyngeal swab specimens only. Nasal  washings and aspirates are unacceptable for Xpert Xpress Flu testing. Performed at [facility], [address] [place], [place] [address]    Dg Chest 2 View  Result Date: 05/27/2018 CLINICAL DATA:  Chest pain with shortness of breath and wheezing EXAM: CHEST - 2 VIEW COMPARISON:  May 03, 2018 FINDINGS: There is no edema or consolidation. The heart size and pulmonary vascularity are normal. No adenopathy. No bone lesions. No pneumothorax. IMPRESSION: No edema or consolidation. Electronically Signed   By: Bretta Bang III M.D.   On: 05/27/2018 09:36    Pending Labs Unresulted Labs (From admission, onward)    Start     Ordered   05/28/18 0500  Basic metabolic panel  Tomorrow morning,   STAT     05/27/18 1223   05/28/18 0500  CBC  Tomorrow morning,   STAT     05/27/18 1223          Vitals/Pain Today's Vitals   05/27/18 1100 05/27/18 1200 05/27/18 1300 05/27/18 1400  BP: (!) 107/59 115/60 (!) 117/53 123/65  Pulse: 99 99 98 97  Resp: (!) 29 (!) 27 (!) 30 (!) 23  Temp:      TempSrc:      SpO2: 93% 92% 91% 95%  Weight:      Height:      PainSc:        Isolation Precautions Droplet precaution  Medications Medications  nicotine (NICODERM CQ - dosed in mg/24 hours) patch 14 mg (14 mg Transdermal Patch Applied 05/27/18 1243)  docusate sodium (COLACE) capsule 100 mg (has no administration in time range)  heparin injection 5,000 Units (5,000 Units Subcutaneous Given 05/27/18 1312)  budesonide (PULMICORT) nebulizer solution 0.25 mg (0.25 mg Nebulization Given 05/27/18 1243)  ipratropium-albuterol (DUONEB) 0.5-2.5 (3) MG/3ML nebulizer solution 3 mL (3 mLs Nebulization Given 05/27/18 1243)  methylPREDNISolone sodium succinate (SOLU-MEDROL) 125 mg/2 mL injection 60 mg (60 mg Intravenous Given 05/27/18 1312)  tiotropium (SPIRIVA) inhalation capsule ([facility] use ONLY) 18 mcg (18 mcg Inhalation Given 05/27/18 1311)   acetaminophen (TYLENOL) tablet 650 mg (has no administration in time range)  oseltamivir (TAMIFLU) capsule 75  mg (has no administration in time range)  albuterol (PROVENTIL) (2.5 MG/3ML) 0.083% nebulizer solution 5 mg (5 mg Nebulization Given 05/27/18 0828)  LORazepam (ATIVAN) tablet 1 mg (1 mg Oral Given 05/27/18 0848)  predniSONE (DELTASONE) tablet 60 mg (60 mg Oral Given 05/27/18 0848)  ipratropium-albuterol (DUONEB) 0.5-2.5 (3) MG/3ML nebulizer solution 3 mL (3 mLs Nebulization Given 05/27/18 0848)  oseltamivir (TAMIFLU) capsule 75 mg (75 mg Oral Given 05/27/18 1103)    Mobility walks

## 2018-05-27 NOTE — ED Provider Notes (Signed)
[facility] Emergency Department Provider Note       Time seen: ----------------------------------------- 8:38 AM on 05/27/2018 -----------------------------------------   I have reviewed the triage vital signs and the nursing notes.  HISTORY   Chief Complaint Asthma    HPI Carlos Maddox is a 48 y.o. male with a history of asthma, COPD, marijuana and occasional cocaine abuse who presents to the ER for shortness of breath.  Patient is here for what he states is an asthma attack that started last night.  He is out of his inhalers was noted to be hypoxic in triage.  He was started on nebulizer treatment.  He was noted to be wheezing throughout on arrival.  He denies recent illness or other complaints.  Past Medical History:  Diagnosis Date  . Asthma     Patient Active Problem List   Diagnosis Date Noted  . COPD exacerbation (HCC) 01/03/2018    Past Surgical History:  Procedure Laterality Date  . PARTIAL HIP ARTHROPLASTY      Allergies Patient has no known allergies.  Social History Social History   Tobacco Use  . Smoking status: Current Every Day Smoker    Types: Cigarettes  . Smokeless tobacco: Never Used  Substance Use Topics  . Alcohol use: Yes  . Drug use: Yes    Types: Marijuana, "Crack" cocaine, Cocaine    Comment: last smoked saturday night   Review of Systems Constitutional: Negative for fever. Cardiovascular: Negative for chest pain. Respiratory: Positive for shortness of breath and wheezing Gastrointestinal: Negative for abdominal pain, vomiting and diarrhea. Musculoskeletal: Negative for back pain. Skin: Negative for rash. Neurological: Negative for headaches, focal weakness or numbness.  All systems negative/normal/unremarkable except as stated in the HPI  ____________________________________________   PHYSICAL EXAM:  VITAL SIGNS: ED Triage Vitals  Enc Vitals Group     BP 05/27/18 0826 100/63     Pulse Rate  05/27/18 0826 (!) 117     Resp 05/27/18 0826 (!) 30     Temp 05/27/18 0826 99.4 F (37.4 C)     Temp Source 05/27/18 0826 Oral     SpO2 05/27/18 0826 (!) 88 %     Weight 05/27/18 0825 145 lb (65.8 kg)     Height 05/27/18 0825 6\' 1"  (1.854 m)     Head Circumference --      Peak Flow --      Pain Score 05/27/18 0825 0     Pain Loc --      Pain Edu? --      Excl. in GC? --    Constitutional: Alert and oriented.  Mild distress, anxious Eyes: Conjunctivae are normal. Normal extraocular movements. Cardiovascular: Normal rate, regular rhythm. No murmurs, rubs, or gallops. Respiratory: Tachypnea with mild wheezing bilaterally Gastrointestinal: Soft and nontender. Normal bowel sounds Musculoskeletal: Nontender with normal range of motion in extremities. No lower extremity tenderness nor edema. Neurologic:  Normal speech and language. No gross focal neurologic deficits are appreciated.  Skin:  Skin is warm, dry and intact. No rash noted. Psychiatric: Mood and affect are normal. Speech and behavior are normal.  ____________________________________________  ED COURSE:  As part of my medical decision making, I reviewed the following data within the electronic MEDICAL RECORD NUMBER History obtained from family if available, nursing notes, old chart and ekg, as well as notes from prior ED visits. Patient presented for asthma exacerbation, we will assess with labs and imaging as indicated at this time.   Procedures  Labs Reviewed  CBC WITH DIFFERENTIAL/PLATELET - Abnormal; Notable for the following components:      Result Value   Platelets 147 (*)    Lymphs Abs 0.4 (*)    All other components within normal limits  BASIC METABOLIC PANEL - Abnormal; Notable for the following components:   Sodium 133 (*)    Glucose, Bld 139 (*)    Calcium 8.6 (*)    All other components within normal limits  INFLUENZA PANEL BY PCR (TYPE A & B) - Abnormal; Notable for the following components:   Influenza A By PCR  POSITIVE (*)    All other components within normal limits  BRAIN NATRIURETIC PEPTIDE  TROPONIN I   EKG: Interpreted by me, sinus tachycardia with a rate of 108 bpm, long QT, normal axis, possible septal infarct age-indeterminate  CRITICAL CARE Performed by: Ulice Dash   Total critical care time: 30 minutes  Critical care time was exclusive of separately billable procedures and treating other patients.  Critical care was necessary to treat or prevent imminent or life-threatening deterioration.  Critical care was time spent personally by me on the following activities: development of treatment plan with patient and/or surrogate as well as nursing, discussions with consultants, evaluation of patient's response to treatment, examination of patient, obtaining history from patient or surrogate, ordering and performing treatments and interventions, ordering and review of laboratory studies, ordering and review of radiographic studies, pulse oximetry and re-evaluation of patient's condition.  RADIOLOGY Images were viewed by me  Chest x-ray IMPRESSION: No edema or consolidation. ____________________________________________   DIFFERENTIAL DIAGNOSIS   COPD, asthma, substance abuse  FINAL ASSESSMENT AND PLAN  Acute asthma exacerbation, hypoxia, influenza A    Plan: The patient had presented for acute asthma exacerbation. Patient's imaging did not reveal any acute process.  He was given duo nebs as well as prednisone and 1 dose of Ativan.  Patient continued to have impressive hypoxia despite a normal history of asthma.  He did not get better with nebs and steroids, requiring supplemental O2.  He was subsequently found to be influenza A positive.  I started him on Tamiflu.  He will need to be admitted due to his hypoxemia   Ulice Dash, MD    Note: This note was generated in part or whole with voice recognition software. Voice recognition is usually quite accurate but  there are transcription errors that can and very often do occur. I apologize for any typographical errors that were not detected and corrected.     Emily Filbert, MD 05/27/18 [phone]

## 2018-05-27 NOTE — ED Triage Notes (Signed)
Pt here for asthma attack. Started last night. Out of inhalers.  Hypoxic on initial triage.  Started nebulizer.  Diminished with wheezing throughout lung fields noted.  Has had dry cough. No fevers.

## 2018-05-27 NOTE — Care Management (Signed)
RNCM received consult to assist with inhalers as it seems patient ran out on his medications. RNCM attempted to speak with the patient regarding his asthma inhalers but the patient asked me to leave the room and that he did not want to speak to anyone at this time. I asked if I could come back at a later time to complete the assessment and the patient would not respond. Will attempt to assess again tomorrow.  Buddy Duty RN BSN RNCM [phone]

## 2018-05-28 LAB — CBC
HCT: 43 % (ref 39.0–52.0)
Hemoglobin: 14.6 g/dL (ref 13.0–17.0)
MCH: 32 pg (ref 26.0–34.0)
MCHC: 34 g/dL (ref 30.0–36.0)
MCV: 94.3 fL (ref 80.0–100.0)
Platelets: 146 10*3/uL — ABNORMAL LOW (ref 150–400)
RBC: 4.56 MIL/uL (ref 4.22–5.81)
RDW: 12.9 % (ref 11.5–15.5)
WBC: 5.7 10*3/uL (ref 4.0–10.5)
nRBC: 0 % (ref 0.0–0.2)

## 2018-05-28 LAB — BASIC METABOLIC PANEL
ANION GAP: 8 (ref 5–15)
BUN: 16 mg/dL (ref 6–20)
CO2: 24 mmol/L (ref 22–32)
Calcium: 9.1 mg/dL (ref 8.9–10.3)
Chloride: 102 mmol/L (ref 98–111)
Creatinine, Ser: 1.25 mg/dL — ABNORMAL HIGH (ref 0.61–1.24)
GFR calc Af Amer: >60 mL/min (ref >60)
GFR calc non Af Amer: >60 mL/min (ref >60)
Glucose, Bld: 137 mg/dL — ABNORMAL HIGH (ref 70–99)
Potassium: 4.5 mmol/L (ref 3.5–5.1)
Sodium: 134 mmol/L — ABNORMAL LOW (ref 135–145)

## 2018-05-28 MED ORDER — GUAIFENESIN-DM 100-10 MG/5ML PO SYRP
5.0000 mL | ORAL_SOLUTION | ORAL | Status: DC | PRN
  Administered 2018-05-28 (×2): 5 mL via ORAL
  Filled 2018-05-28 (×3): qty 5

## 2018-05-28 NOTE — Plan of Care (Signed)
Currently on room air at 93%. Eating well. Robitussin given for cough. On Droplet precautions. Urine output >30cc per hr.

## 2018-05-28 NOTE — Progress Notes (Signed)
[employer] [facility] [place] [facility]                                                                                                                                                                                  Patient Demographics   Carlos Maddox, is a 48 y.o. male, DOB - 1970-07-01, IWO:[id number]  Admit date - 05/27/2018   Admitting Physician Altamese Dilling, MD  Outpatient Primary MD for the patient is Leanna Sato, MD   LOS - 1  Subjective: Patient continues to complain of shortness of breath and body aches.    Review of Systems:   CONSTITUTIONAL: No documented fever. No fatigue, weakness. No weight gain, no weight loss.  EYES: No blurry or double vision.  ENT: No tinnitus. No postnasal drip. No redness of the oropharynx.  RESPIRATORY: No cough, positive wheeze, no hemoptysis.  Positive dyspnea.  CARDIOVASCULAR: No chest pain. No orthopnea. No palpitations. No syncope.  GASTROINTESTINAL: No nausea, no vomiting or diarrhea. No abdominal pain. No melena or hematochezia.  GENITOURINARY: No dysuria or hematuria.  ENDOCRINE: No polyuria or nocturia. No heat or cold intolerance.  HEMATOLOGY: No anemia. No bruising. No bleeding.  INTEGUMENTARY: No rashes. No lesions.  MUSCULOSKELETAL: No arthritis. No swelling. No gout.  NEUROLOGIC: No numbness, tingling, or ataxia. No seizure-type activity.  PSYCHIATRIC: No anxiety. No insomnia. No ADD.    Vitals:   Vitals:   05/28/18 0029 05/28/18 0430 05/28/18 0739 05/28/18 1207  BP:  (!) 102/57  115/74  Pulse: 94 85  70  Resp:  20  16  Temp: 99.5 F (37.5 C) 98.2 F (36.8 C)  98.7 F (37.1 C)  TempSrc: Oral Oral  Oral  SpO2: 98% 93% 93% 98%  Weight:      Height:        Wt Readings from Last 3 Encounters:  05/27/18 65.8 kg  04/04/18 65.8 kg  01/03/18 62.1 kg     Intake/Output Summary (Last 24 hours) at 05/28/2018 1401 Last data filed at 05/28/2018 0600 Gross per 24 hour  Intake -  Output 950 ml  Net  -950 ml    Physical Exam:   GENERAL: Pleasant-appearing in no apparent distress.  HEAD, EYES, EARS, NOSE AND THROAT: Atraumatic, normocephalic. Extraocular muscles are intact. Pupils equal and reactive to light. Sclerae anicteric. No conjunctival injection. No oro-pharyngeal erythema.  NECK: Supple. There is no jugular venous distention. No bruits, no lymphadenopathy, no thyromegaly.  HEART: Regular rate and rhythm,. No murmurs, no rubs, no clicks.  LUNGS: Occasional wheezing in bilateral lungs.  ABDOMEN: Soft, flat, nontender, nondistended. Has good bowel sounds. No hepatosplenomegaly appreciated.  EXTREMITIES: No evidence of any cyanosis,  clubbing, or peripheral edema.  +2 pedal and radial pulses bilaterally.  NEUROLOGIC: The patient is alert, awake, and oriented x3 with no focal motor or sensory deficits appreciated bilaterally.  SKIN: Moist and warm with no rashes appreciated.  Psych: Not anxious, depressed LN: No inguinal LN enlargement    Antibiotics   Anti-infectives (From admission, onward)   Start     Dose/Rate Route Frequency Ordered Stop   05/27/18 2200  oseltamivir (TAMIFLU) capsule 75 mg     75 mg Oral 2 times daily 05/27/18 1222 06/01/18 2159   05/27/18 1100  oseltamivir (TAMIFLU) capsule 75 mg     75 mg Oral  Once 05/27/18 1055 05/27/18 1103      Medications   Scheduled Meds: . budesonide (PULMICORT) nebulizer solution  0.25 mg Nebulization BID  . heparin  5,000 Units Subcutaneous Q8H  . ipratropium-albuterol  3 mL Nebulization Q6H  . methylPREDNISolone (SOLU-MEDROL) injection  60 mg Intravenous Q6H  . nicotine  14 mg Transdermal Daily  . oseltamivir  75 mg Oral BID  . tiotropium  18 mcg Inhalation Daily   Continuous Infusions: PRN Meds:.acetaminophen, docusate sodium, guaiFENesin-dextromethorphan   Data Review:   Micro Results No results found for this or any previous visit (from the past 240 hour(s)).  Radiology Reports Dg Chest 2 View  Result  Date: 05/27/2018 CLINICAL DATA:  Chest pain with shortness of breath and wheezing EXAM: CHEST - 2 VIEW COMPARISON:  May 03, 2018 FINDINGS: There is no edema or consolidation. The heart size and pulmonary vascularity are normal. No adenopathy. No bone lesions. No pneumothorax. IMPRESSION: No edema or consolidation. Electronically Signed   By: Bretta BangWilliam  Woodruff III M.D.   On: 05/27/2018 09:36   Dg Chest 2 View  Result Date: 05/03/2018 CLINICAL DATA:  Shortness of Breath EXAM: CHEST - 2 VIEW COMPARISON:  January 03, 2018 FINDINGS: There is mild upper lobe scarring. There is no edema or consolidation. Heart size and pulmonary vascularity are normal. No adenopathy. No pneumothorax. No bone lesions. IMPRESSION: Stable mild scarring in the upper lobes. No edema or consolidation. Stable cardiac silhouette. Electronically Signed   By: Bretta BangWilliam  Woodruff III M.D.   On: 05/03/2018 13:54     CBC Recent Labs  Lab 05/27/18 1006 05/28/18 0535  WBC 5.8 5.7  HGB 13.6 14.6  HCT 40.8 43.0  PLT 147* 146*  MCV 94.9 94.3  MCH 31.6 32.0  MCHC 33.3 34.0  RDW 12.9 12.9  LYMPHSABS 0.4*  --   MONOABS 0.4  --   EOSABS 0.0  --   BASOSABS 0.0  --     Chemistries  Recent Labs  Lab 05/27/18 1006 05/28/18 0535  NA 133* 134*  K 3.7 4.5  CL 101 102  CO2 22 24  GLUCOSE 139* 137*  BUN 13 16  CREATININE 1.10 1.25*  CALCIUM 8.6* 9.1   ------------------------------------------------------------------------------------------------------------------ estimated creatinine clearance is 68.7 mL/min (A) (by C-G formula based on SCr of 1.25 mg/dL (H)). ------------------------------------------------------------------------------------------------------------------ No results for input(s): HGBA1C in the last 72 hours. ------------------------------------------------------------------------------------------------------------------ No results for input(s): CHOL, HDL, LDLCALC, TRIG, CHOLHDL, LDLDIRECT in the last 72  hours. ------------------------------------------------------------------------------------------------------------------ No results for input(s): TSH, T4TOTAL, T3FREE, THYROIDAB in the last 72 hours.  Invalid input(s): FREET3 ------------------------------------------------------------------------------------------------------------------ No results for input(s): VITAMINB12, FOLATE, FERRITIN, TIBC, IRON, RETICCTPCT in the last 72 hours.  Coagulation profile No results for input(s): INR, PROTIME in the last 168 hours.  No results for input(s): DDIMER in the last 72 hours.  Cardiac  Enzymes Recent Labs  Lab 05/27/18 1006  TROPONINI <0.03   ------------------------------------------------------------------------------------------------------------------ Invalid input(s): POCBNP    Assessment & Plan   *Acute respiratory failure with hypoxia Asthma exacerbation Secondary to influenza A  Continue IV and inhaled steroid Nebulizer treatment, Spiriva, supplemental oxygen Tamiflu.  *Active smoking Counseled to quit smoking yesterday       Code Status Orders  (From admission, onward)         Start     Ordered   05/27/18 1224  Full code  Continuous     05/27/18 1223        Code Status History    Date Active Date Inactive Code Status Order ID Comments User Context   01/03/2018 1103 01/04/2018 1550 Full Code [id number]  Shaune Pollack, MD Inpatient           Consults none   DVT Prophylaxis  Lovenox  Lab Results  Component Value Date   PLT 146 (L) 05/28/2018     Time Spent in minutes   35 minutes  Greater than 50% of time spent in care coordination and counseling patient regarding the condition and plan of care.   Auburn Bilberry M.D on 05/28/2018 at 2:01 PM  Between 7am to 6pm - Pager - [phone]  After 6pm go to [email or web address] - Social research officer, government  [employer]   Office  [phone]

## 2018-05-29 MED ORDER — ALBUTEROL SULFATE HFA 108 (90 BASE) MCG/ACT IN AERS: 2.0000 | INHALATION_SPRAY | Freq: Four times a day (QID) | RESPIRATORY_TRACT | 2 refills | Status: AC | PRN

## 2018-05-29 MED ORDER — OSELTAMIVIR PHOSPHATE 75 MG PO CAPS: 75.0000 mg | ORAL_CAPSULE | Freq: Two times a day (BID) | ORAL | 0 refills | Status: AC

## 2018-05-29 MED ORDER — FLUTICASONE-SALMETEROL 100-50 MCG/DOSE IN AEPB: 1.0000 | INHALATION_SPRAY | Freq: Two times a day (BID) | RESPIRATORY_TRACT | 11 refills | Status: AC

## 2018-05-29 MED ORDER — GUAIFENESIN-DM 100-10 MG/5ML PO SYRP: 15.0000 mL | ORAL_SOLUTION | ORAL | 0 refills | Status: DC | PRN

## 2018-05-29 NOTE — Discharge Summary (Signed)
[facility] - [place] at [facility], 48 y.o., DOB 03-Jan-1971, MRN [id number]. Admission date: 05/27/2018 Discharge Date 05/29/2018 Primary MD Leanna Sato, MD Admitting Physician Altamese Dilling, MD  Admission Diagnosis  Influenza A [J10.1] Exacerbation of asthma, unspecified asthma severity, unspecified whether persistent [J45.901]  Discharge Diagnosis   Principal Problem:   Acute respiratory failure with hypoxia (HCC) Acute asthma exacerbation Influenza A Nicotine abuse smoking cessation provided    Hospital Course Carlos Maddox  is a 48 y.o. male with a known history of asthma, noncompliant with medications or doctors follow-up-since yesterday having cough without sputum and shortness of breath.  Patient was very hypoxic in need of oxygen therapy.  He was admitted and treated for the flu as well as asthma exasperation.  Patient is doing much better.             Consults  None  Significant Tests:  See full reports for all details     Dg Chest 2 View  Result Date: 05/27/2018 CLINICAL DATA:  Chest pain with shortness of breath and wheezing EXAM: CHEST - 2 VIEW COMPARISON:  May 03, 2018 FINDINGS: There is no edema or consolidation. The heart size and pulmonary vascularity are normal. No adenopathy. No bone lesions. No pneumothorax. IMPRESSION: No edema or consolidation. Electronically Signed   By: Bretta Bang III M.D.   On: 05/27/2018 09:36   Dg Chest 2 View  Result Date: 05/03/2018 CLINICAL DATA:  Shortness of Breath EXAM: CHEST - 2 VIEW COMPARISON:  January 03, 2018 FINDINGS: There is mild upper lobe scarring. There is no edema or consolidation. Heart size and pulmonary vascularity are normal. No adenopathy. No pneumothorax. No bone lesions. IMPRESSION: Stable mild scarring in the upper lobes. No edema or consolidation. Stable cardiac silhouette. Electronically Signed   By: Bretta Bang III M.D.   On: 05/03/2018 13:54        Today   Subjective:   Carlos Maddox patient's breathing is improved  Objective:   Blood pressure 111/70, pulse (!) 58, temperature (!) 97.4 F (36.3 C), temperature source Oral, resp. rate 16, height 6\' 1"  (1.854 m), weight 65.8 kg, SpO2 93 %.  .  Intake/Output Summary (Last 24 hours) at 05/29/2018 1236 Last data filed at 05/29/2018 0642 Gross per 24 hour  Intake 0 ml  Output 1425 ml  Net -1425 ml    Exam VITAL SIGNS: Blood pressure 111/70, pulse (!) 58, temperature (!) 97.4 F (36.3 C), temperature source Oral, resp. rate 16, height 6\' 1"  (1.854 m), weight 65.8 kg, SpO2 93 %.  GENERAL:  48 y.o.-year-old patient lying in the bed with no acute distress.  EYES: Pupils equal, round, reactive to light and accommodation. No scleral icterus. Extraocular muscles intact.  HEENT: Head atraumatic, normocephalic. Oropharynx and nasopharynx clear.  NECK:  Supple, no jugular venous distention. No thyroid enlargement, no tenderness.  LUNGS: Normal breath sounds bilaterally, no wheezing, rales,rhonchi or crepitation. No use of accessory muscles of respiration.  CARDIOVASCULAR: S1, S2 normal. No murmurs, rubs, or gallops.  ABDOMEN: Soft, nontender, nondistended. Bowel sounds present. No organomegaly or mass.  EXTREMITIES: No pedal edema, cyanosis, or clubbing.  NEUROLOGIC: Cranial nerves II through XII are intact. Muscle strength 5/5 in all extremities. Sensation intact. Gait not checked.  PSYCHIATRIC: The patient is alert and oriented x 3.  SKIN: No obvious rash, lesion, or ulcer.   Data Review     CBC w Diff:  Lab Results  Component Value Date  WBC 5.7 05/28/2018   HGB 14.6 05/28/2018   HGB 14.8 08/25/2013   HCT 43.0 05/28/2018   HCT 44.3 08/25/2013   PLT 146 (L) 05/28/2018   PLT 162 08/25/2013   LYMPHOPCT 6 05/27/2018   MONOPCT 7 05/27/2018   EOSPCT 0 05/27/2018   BASOPCT 0 05/27/2018   CMP:  Lab Results  Component Value Date   NA 134 (L) 05/28/2018   NA 135 (L)  08/26/2013   K 4.5 05/28/2018   K 4.4 08/26/2013   CL 102 05/28/2018   CL 104 08/26/2013   CO2 24 05/28/2018   CO2 25 08/26/2013   BUN 16 05/28/2018   BUN 15 08/26/2013   CREATININE 1.25 (H) 05/28/2018   CREATININE 1.26 08/26/2013   PROT 7.5 01/03/2018   PROT 7.9 08/25/2013   ALBUMIN 4.2 01/03/2018   ALBUMIN 4.1 08/25/2013   BILITOT 0.6 01/03/2018   BILITOT 0.5 08/25/2013   ALKPHOS 81 01/03/2018   ALKPHOS 86 08/25/2013   AST 18 01/03/2018   AST 21 08/25/2013   ALT 12 01/03/2018   ALT 17 08/25/2013  .  Micro Results No results found for this or any previous visit (from the past 240 hour(s)).      Code Status Orders  (From admission, onward)         Start     Ordered   05/27/18 1224  Full code  Continuous     05/27/18 1223        Code Status History    Date Active Date Inactive Code Status Order ID Comments User Context   01/03/2018 1103 01/04/2018 1550 Full Code [id number]  Shaune Pollack, MD Inpatient          Follow-up Information    Leanna Sato, MD Follow up in 6 day(s).   Specialty:  Family Medicine Contact information: [address] [place] [address] [phone]           Discharge Medications   Allergies as of 05/29/2018   No Known Allergies     Medication List    STOP taking these medications   nicotine 14 mg/24hr patch Commonly known as:  NICODERM CQ - dosed in mg/24 hours   predniSONE 50 MG tablet Commonly known as:  DELTASONE     TAKE these medications   albuterol 108 (90 Base) MCG/ACT inhaler Commonly known as:  PROVENTIL HFA;VENTOLIN HFA Inhale 2 puffs into the lungs every 6 (six) hours as needed for wheezing or shortness of breath.   Fluticasone-Salmeterol 100-50 MCG/DOSE Aepb Commonly known as:  ADVAIR DISKUS Inhale 1 puff into the lungs 2 (two) times daily.   guaiFENesin-dextromethorphan 100-10 MG/5ML syrup Commonly known as:  ROBITUSSIN DM Take 15 mLs by mouth every 4 (four) hours as needed for cough.    oseltamivir 75 MG capsule Commonly known as:  TAMIFLU Take 1 capsule (75 mg total) by mouth 2 (two) times daily for 3 days.          Total Time in preparing paper work, data evaluation and todays exam - 35 minutes  Auburn Bilberry M.D on 05/29/2018 at 12:36 PM [facility]   Office  [phone]

## 2018-05-29 NOTE — Progress Notes (Signed)
Dr. Allena KatzPatel rounded and ordered discharge home. Discharge instructions, prescriptions and instructions for follow up appointment was reviewed with patient. Questions were encouraged. Will call for wheelchair when patient is ready.

## 2019-07-09 ENCOUNTER — Other Ambulatory Visit: Payer: Self-pay

## 2019-07-09 DIAGNOSIS — U071 COVID-19: Secondary | ICD-10-CM | POA: Insufficient documentation

## 2019-07-09 DIAGNOSIS — J449 Chronic obstructive pulmonary disease, unspecified: Secondary | ICD-10-CM | POA: Insufficient documentation

## 2019-07-09 DIAGNOSIS — J45909 Unspecified asthma, uncomplicated: Secondary | ICD-10-CM | POA: Insufficient documentation

## 2019-07-09 DIAGNOSIS — R52 Pain, unspecified: Secondary | ICD-10-CM | POA: Diagnosis present

## 2019-07-09 DIAGNOSIS — F17210 Nicotine dependence, cigarettes, uncomplicated: Secondary | ICD-10-CM | POA: Insufficient documentation

## 2019-07-09 LAB — CBC
HCT: 44.4 % (ref 39.0–52.0)
Hemoglobin: 14.8 g/dL (ref 13.0–17.0)
MCH: 33.4 pg (ref 26.0–34.0)
MCHC: 33.3 g/dL (ref 30.0–36.0)
MCV: 100.2 fL — ABNORMAL HIGH (ref 80.0–100.0)
Platelets: 116 10*3/uL — ABNORMAL LOW (ref 150–400)
RBC: 4.43 MIL/uL (ref 4.22–5.81)
RDW: 12.8 % (ref 11.5–15.5)
WBC: 4.1 10*3/uL (ref 4.0–10.5)
nRBC: 0 % (ref 0.0–0.2)

## 2019-07-09 LAB — URINALYSIS, COMPLETE (UACMP) WITH MICROSCOPIC
Bacteria, UA: NONE SEEN
Bilirubin Urine: NEGATIVE
Glucose, UA: NEGATIVE mg/dL
Hgb urine dipstick: NEGATIVE
Ketones, ur: NEGATIVE mg/dL
Leukocytes,Ua: NEGATIVE
Nitrite: NEGATIVE
Protein, ur: NEGATIVE mg/dL
Specific Gravity, Urine: 1.018 (ref 1.005–1.030)
pH: 7 (ref 5.0–8.0)

## 2019-07-09 LAB — COMPREHENSIVE METABOLIC PANEL
ALT: 13 U/L (ref 0–44)
AST: 19 U/L (ref 15–41)
Albumin: 3.5 g/dL (ref 3.5–5.0)
Alkaline Phosphatase: 51 U/L (ref 38–126)
Anion gap: 8 (ref 5–15)
BUN: 10 mg/dL (ref 6–20)
CO2: 26 mmol/L (ref 22–32)
Calcium: 8.6 mg/dL — ABNORMAL LOW (ref 8.9–10.3)
Chloride: 99 mmol/L (ref 98–111)
Creatinine, Ser: 0.9 mg/dL (ref 0.61–1.24)
GFR calc Af Amer: >60 mL/min (ref >60)
GFR calc non Af Amer: >60 mL/min (ref >60)
Glucose, Bld: 108 mg/dL — ABNORMAL HIGH (ref 70–99)
Potassium: 3.9 mmol/L (ref 3.5–5.1)
Sodium: 133 mmol/L — ABNORMAL LOW (ref 135–145)
Total Bilirubin: 0.4 mg/dL (ref 0.3–1.2)
Total Protein: 6.5 g/dL (ref 6.5–8.1)

## 2019-07-09 LAB — LIPASE, BLOOD: Lipase: 25 U/L (ref 11–51)

## 2019-07-09 NOTE — ED Triage Notes (Signed)
Patient c/o generalized body aches, diarrhea, and headache X 2 days. Patient denies emesis.

## 2019-07-10 ENCOUNTER — Emergency Department
Admission: EM | Admit: 2019-07-10 | Discharge: 2019-07-10 | Disposition: A | Discharge status: Home / Self Care | Payer: Medicaid Other | Attending: Emergency Medicine | Admitting: Emergency Medicine

## 2019-07-10 ENCOUNTER — Encounter: Payer: Self-pay | Admitting: Emergency Medicine

## 2019-07-10 DIAGNOSIS — U071 COVID-19: Secondary | ICD-10-CM

## 2019-07-10 HISTORY — DX: COVID-19: U07.1

## 2019-07-10 LAB — POC SARS CORONAVIRUS 2 AG: SARS Coronavirus 2 Ag: POSITIVE — AB

## 2019-07-10 MED ORDER — HYDROCODONE-CHLORPHENIRAMINE 5-4 MG/5ML PO SOLN: 5.0000 mL | Freq: Four times a day (QID) | ORAL | 0 refills | Status: DC | PRN

## 2019-07-10 MED ORDER — ONDANSETRON 4 MG PO TBDP
4.0000 mg | ORAL_TABLET | Freq: Once | ORAL | Status: AC
  Administered 2019-07-10: 4 mg via ORAL
  Filled 2019-07-10: qty 1

## 2019-07-10 MED ORDER — ONDANSETRON 4 MG PO TBDP: ORAL_TABLET | ORAL | 0 refills | Status: DC

## 2019-07-10 MED ORDER — ACETAMINOPHEN 500 MG PO TABS
1000.0000 mg | ORAL_TABLET | Freq: Once | ORAL | Status: AC
  Administered 2019-07-10: 1000 mg via ORAL
  Filled 2019-07-10: qty 2

## 2019-07-10 NOTE — ED Provider Notes (Signed)
[facility] Emergency Department Provider Note  ____________________________________________   First MD Initiated Contact with Patient 07/10/19 0209     (approximate)  I have reviewed the triage vital signs and the nursing notes.   HISTORY  Chief Complaint Generalized Body Aches and Diarrhea    HPI Carlos Maddox is a 49 y.o. male with history of asthma who presents for evaluation of 3 days of generalized body aches, malaise, chills, generalized aching abdominal pain, diarrhea, nausea, and generalized headache.  No visual changes.  No history of trauma.  No chest pain, shortness of breath, nor cough.  No sore throat.  No vomiting, decreased oral intake.  No known COVID-19 contacts but he points out "I probably have though [come in contact with people with Covid]".  He has not tried taking any medication at home for his symptoms, and therefore nothing in particular makes the symptoms better or worse.         Past Medical History:  Diagnosis Date  . Asthma            Patient Active Problem List   Diagnosis Date Noted  . Acute respiratory failure with hypoxia (HCC) 05/27/2018  . Influenza A 05/27/2018  . Asthma, chronic, unspecified asthma severity, with acute exacerbation 05/27/2018  . COPD exacerbation (HCC) 01/03/2018    Past Surgical History:  Procedure Laterality Date  . PARTIAL HIP ARTHROPLASTY      Prior to Admission medications   Medication Sig Start Date End Date Taking? Authorizing Provider  albuterol (PROVENTIL HFA;VENTOLIN HFA) 108 (90 Base) MCG/ACT inhaler Inhale 2 puffs into the lungs every 6 (six) hours as needed for wheezing or shortness of breath. 05/29/18   Auburn Bilberry, MD  Fluticasone-Salmeterol (ADVAIR DISKUS) 100-50 MCG/DOSE AEPB Inhale 1 puff into the lungs 2 (two) times daily. 05/29/18 05/29/19  Auburn Bilberry, MD  HYDROcodone-Chlorpheniramine 5-4 MG/5ML SOLN Take 5 mLs by mouth every 6 (six) hours as needed (cough).  07/10/19   Loleta Rose, MD  ondansetron (ZOFRAN ODT) 4 MG disintegrating tablet Allow 1-2 tablets to dissolve in your mouth every 8 hours as needed for nausea/vomiting 07/10/19   Loleta Rose, MD    Allergies Patient has no known allergies.  Family History  Problem Relation Age of Onset  . Hypertension Mother     Social History Social History   Tobacco Use  . Smoking status: Current Every Day Smoker    Types: Cigarettes  . Smokeless tobacco: Never Used  Substance Use Topics  . Alcohol use: Yes  . Drug use: Yes    Types: Marijuana, "Crack" cocaine, Cocaine    Comment: last smoked saturday night    Review of Systems Constitutional: Chills and malaise. Eyes: No visual changes. ENT: No sore throat. Cardiovascular: Denies chest pain. Respiratory: Denies shortness of breath. Gastrointestinal: Generalized aching abdominal pain with diarrhea and nausea, no vomiting. Genitourinary: Negative for dysuria. Musculoskeletal: Generalized body aches. Integumentary: Negative for rash. Neurological: Generalized headache.  No focal numbness nor weakness.   ____________________________________________   PHYSICAL EXAM:  VITAL SIGNS: ED Triage Vitals  Enc Vitals Group     BP 07/09/19 2211 121/75     Pulse Rate 07/09/19 2211 92     Resp 07/09/19 2211 17     Temp 07/09/19 2211 98.5 F (36.9 C)     Temp src --      SpO2 07/09/19 2211 100 %     Weight 07/09/19 2209 63.5 kg (140 lb)     Height  07/09/19 2209 1.854 m (6\' 1" )     Head Circumference --      Peak Flow --      Pain Score 07/09/19 2209 10     Pain Loc --      Pain Edu? --      Excl. in [place]? --     Constitutional: Alert and oriented.  Patient looks uncomfortable but is not in severe distress. Eyes: Conjunctivae are normal.  Head: Atraumatic. Nose: No congestion/rhinnorhea. Mouth/Throat: Patient is wearing a mask. Neck: No stridor.  No meningeal signs.   Cardiovascular: Normal rate, regular rhythm. Good peripheral  circulation. Grossly normal heart sounds. Respiratory: Normal respiratory effort.  No retractions. Gastrointestinal: Thin and muscular body habitus.  Abdomen is nondistended.  He has no tenderness to palpation throughout the abdomen.  No rebound and no guarding. Musculoskeletal: No lower extremity tenderness nor edema. No gross deformities of extremities. Neurologic:  Normal speech and language. No gross focal neurologic deficits are appreciated.  Skin:  Skin is warm, dry and intact. Psychiatric: Mood and affect are normal. Speech and behavior are normal.  ____________________________________________   LABS (all labs ordered are listed, but only abnormal results are displayed)  Labs Reviewed  COMPREHENSIVE METABOLIC PANEL - Abnormal; Notable for the following components:      Result Value   Sodium 133 (*)    Glucose, Bld 108 (*)    Calcium 8.6 (*)    All other components within normal limits  CBC - Abnormal; Notable for the following components:   MCV 100.2 (*)    Platelets 116 (*)    All other components within normal limits  URINALYSIS, COMPLETE (UACMP) WITH MICROSCOPIC - Abnormal; Notable for the following components:   Color, Urine YELLOW (*)    APPearance CLEAR (*)    All other components within normal limits  POC SARS CORONAVIRUS 2 AG - Abnormal; Notable for the following components:   SARS Coronavirus 2 Ag POSITIVE (*)    All other components within normal limits  LIPASE, BLOOD  POC SARS CORONAVIRUS 2 AG -  ED   ____________________________________________  EKG  No indication for emergent EKG ____________________________________________  RADIOLOGY I, 2210, personally viewed and evaluated these images (plain radiographs) as part of my medical decision making, as well as reviewing the written report by the radiologist.  ED MD interpretation: No indication for chest x-ray given no respiratory symptoms.  Official radiology report(s): No results found.   ____________________________________________   PROCEDURES   Procedure(s) performed (including Critical Care):  Procedures   ____________________________________________   INITIAL IMPRESSION / MDM / ASSESSMENT AND PLAN / ED COURSE  As part of my medical decision making, I reviewed the following data within the electronic MEDICAL RECORD NUMBER Nursing notes reviewed and incorporated, Labs reviewed , Old chart reviewed, Notes from prior ED visits and [place] Controlled Substance Database   Differential diagnosis includes, but is not limited to, COVID-19, viral gastroenteritis, bacterial GI infection leading to diarrhea and abdominal pain, other acute intra-abdominal infection such as appendicitis or diverticulitis.  Given the constellation of symptoms I ordered a rapid antigen test for COVID-19 which was positive.  This explains all of his symptoms.  Fortunately he is having no respiratory symptoms and he has no hypoxemia.  His physical exam was reassuring with no tenderness to palpation of his abdomen and no rebound or guarding.  His CBC, urinalysis, and comprehensive metabolic panel are all reassuring.  I informed the patient of his COVID-19 diagnosis.  I  gave him my usual and customary COVID-19 management and follow-up recommendations as well as return precautions.  I am prescribing him medications as listed below.  He understands and agrees with the plan.  I also ordered Tylenol 1000 mg by mouth and Zofran 4 mg ODT prior to discharge.          ____________________________________________  FINAL CLINICAL IMPRESSION(S) / ED DIAGNOSES  Final diagnoses:  COVID-19     MEDICATIONS GIVEN DURING THIS VISIT:  Medications  ondansetron (ZOFRAN-ODT) disintegrating tablet 4 mg (has no administration in time range)  acetaminophen (TYLENOL) tablet 1,000 mg (has no administration in time range)     ED Discharge Orders         Ordered    ondansetron (ZOFRAN ODT) 4 MG disintegrating tablet      07/10/19 0232    HYDROcodone-Chlorpheniramine 5-4 MG/5ML SOLN  Every 6 hours PRN     07/10/19 0247          *Please note:  Carlos Maddox was evaluated in Emergency Department on 07/10/2019 for the symptoms described in the history of present illness. He was evaluated in the context of the global COVID-19 pandemic, which necessitated consideration that the patient might be at risk for infection with the SARS-CoV-2 virus that causes COVID-19. Institutional protocols and algorithms that pertain to the evaluation of patients at risk for COVID-19 are in a state of rapid change based on information released by regulatory bodies including the CDC and federal and state organizations. These policies and algorithms were followed during the patient's care in the ED.  Some ED evaluations and interventions may be delayed as a result of limited staffing during the pandemic.*  Note:  This document was prepared using Dragon voice recognition software and may include unintentional dictation errors.   Hinda Kehr, MD 07/10/19 [phone]

## 2019-07-10 NOTE — Discharge Instructions (Signed)
As we discussed, although you have tested positive for COVID-19 (coronavirus), you do not need to be hospitalized at this time.  Read through all the included information including the recommendations from the CDC.  We recommend that you self-quarantine at home with your immediate family only (people with whom you have already been in contact) for 10-14 days after your fever has gone away (without taking medication to make your temperature come down, such as Tylenol (acetaminophen)), after your respiratory symptoms have improved, and after at least 14 days have passed since your symptoms first appeared.  You should have as minimal contact as possible with anyone else including close family as per the [facility] paperwork guidelines listed below. Follow-up with your doctor by phone or online as needed and return immediately to the emergency department or call 911 only if you develop new or worsening symptoms that concern you.  If you were prescribed any medications, please use them as instructed.  You can find up-to-date information about COVID-19 in [place] by calling the [facility]: [phone]. You may also call 2-1-1, or [phone], or additional resources.  You can also find information online at https://miller-white.com/, or on the Center for Disease Control (CDC) website at BeginnerSteps.be.

## 2019-08-22 IMAGING — CR DG CHEST 2V
2 series · 2 of 2 positions shown · non-contrast
Comparison: May 03, 2018

CLINICAL DATA: Chest pain with shortness of breath and wheezing

EXAM:
CHEST - 2 VIEW

[chest pa]
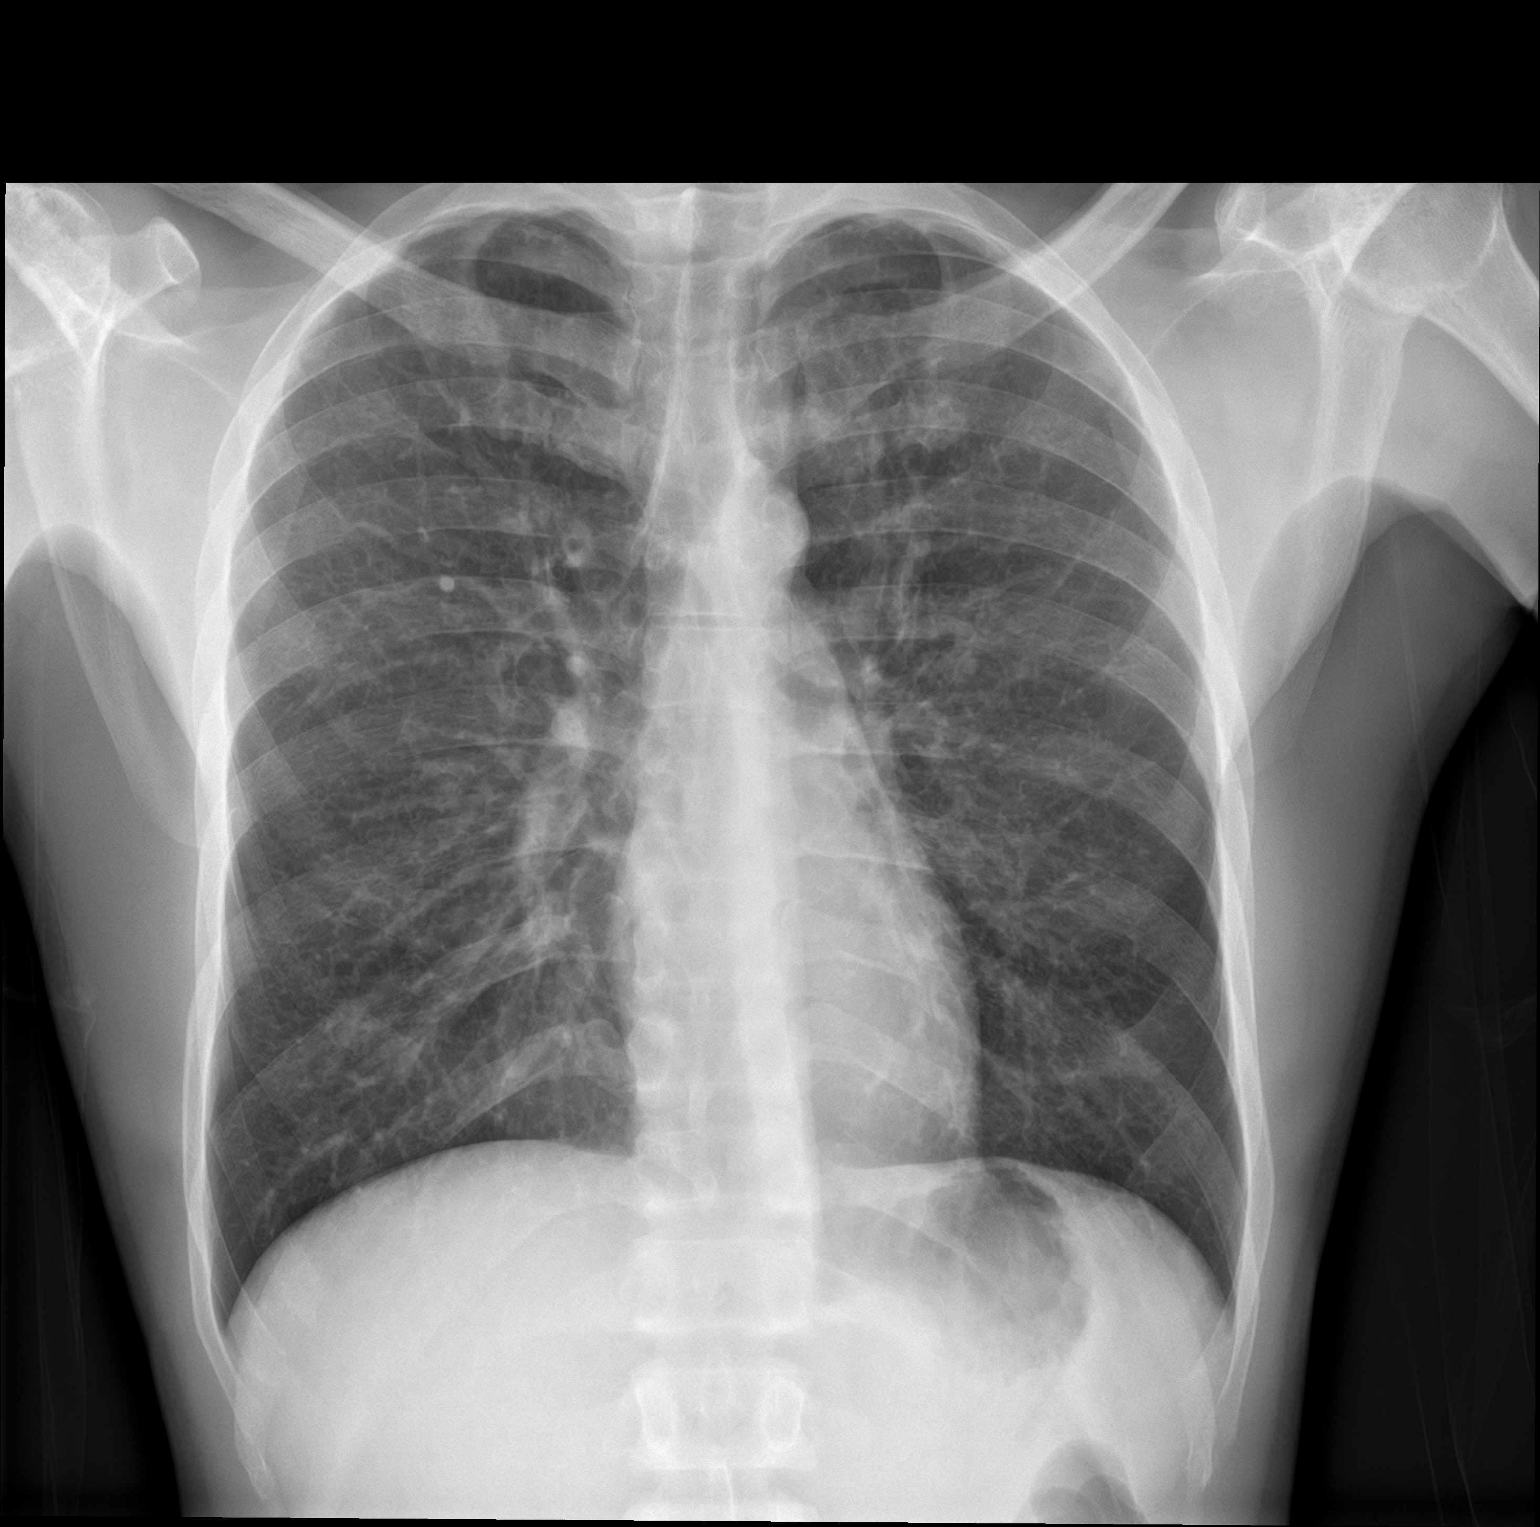

[chest lat]
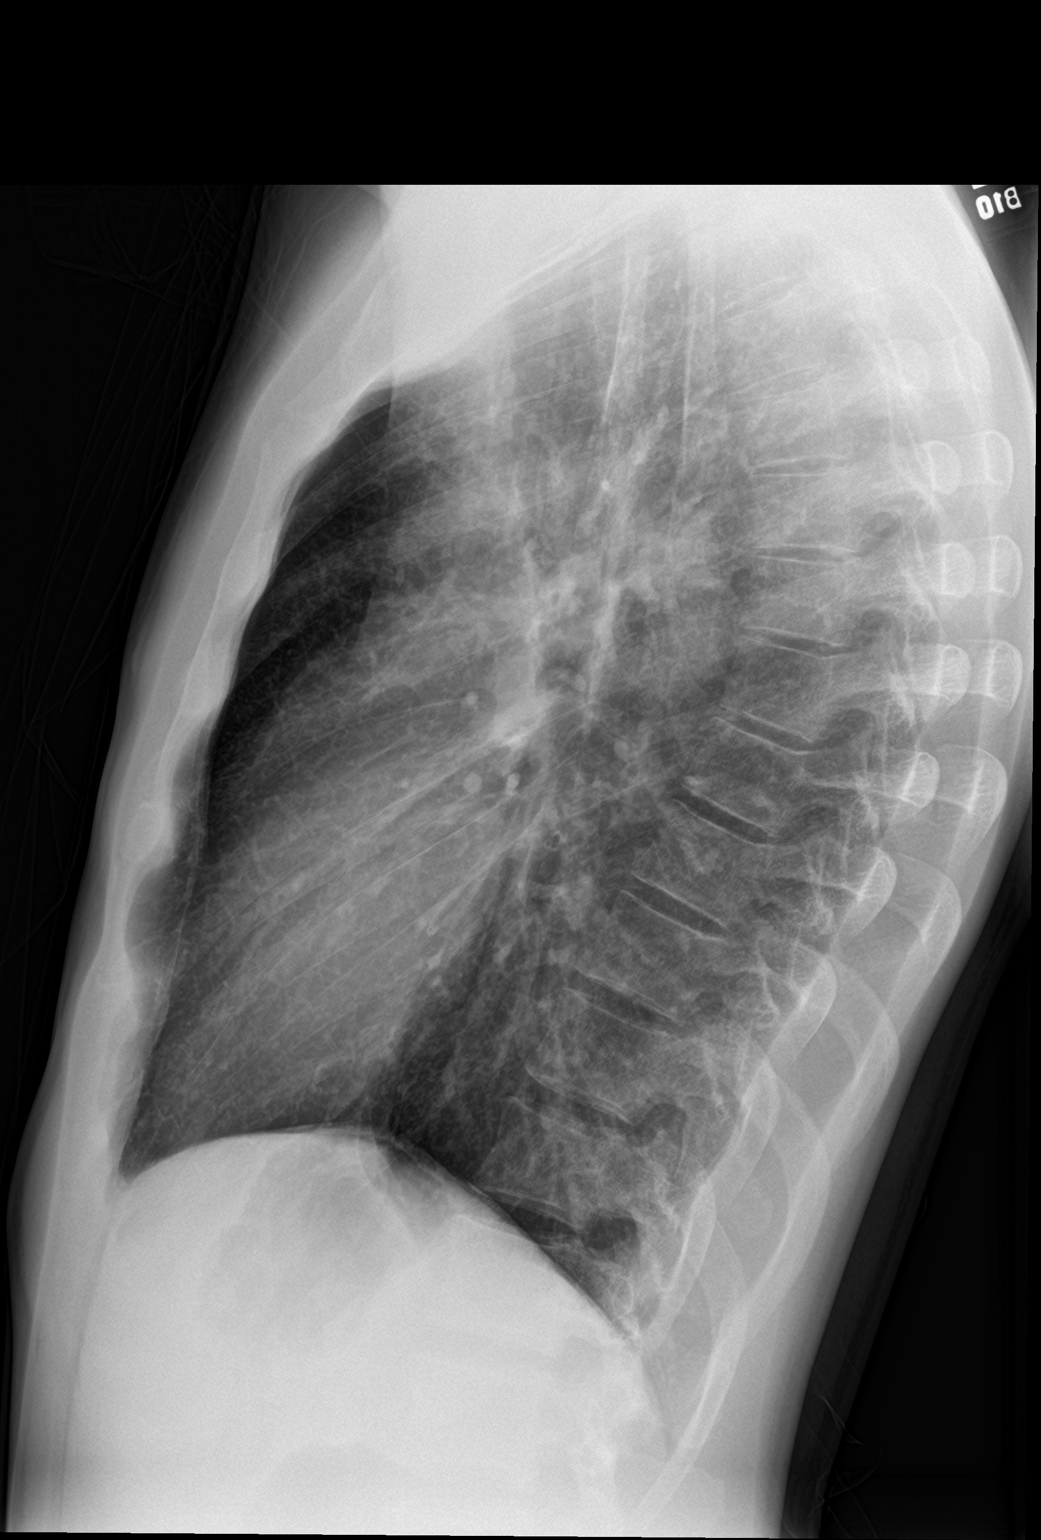

[2 of 2 positions shown; findings below may reference images not displayed]

FINDINGS: There is no edema or consolidation. The heart size and pulmonary
vascularity are normal. No adenopathy. No bone lesions. No
pneumothorax.
IMPRESSION: No edema or consolidation.

## 2021-05-26 ENCOUNTER — Emergency Department
Admission: EM | Admit: 2021-05-26 | Discharge: 2021-05-26 | Disposition: A | Discharge status: Home / Self Care | Payer: Medicaid Other | Attending: Emergency Medicine | Admitting: Emergency Medicine

## 2021-05-26 ENCOUNTER — Emergency Department: Payer: Medicaid Other

## 2021-05-26 ENCOUNTER — Encounter: Payer: Self-pay | Admitting: Emergency Medicine

## 2021-05-26 ENCOUNTER — Other Ambulatory Visit: Payer: Self-pay

## 2021-05-26 DIAGNOSIS — J181 Lobar pneumonia, unspecified organism: Secondary | ICD-10-CM | POA: Insufficient documentation

## 2021-05-26 DIAGNOSIS — J449 Chronic obstructive pulmonary disease, unspecified: Secondary | ICD-10-CM | POA: Diagnosis not present

## 2021-05-26 DIAGNOSIS — J189 Pneumonia, unspecified organism: Secondary | ICD-10-CM

## 2021-05-26 DIAGNOSIS — R0602 Shortness of breath: Secondary | ICD-10-CM | POA: Diagnosis present

## 2021-05-26 DIAGNOSIS — R197 Diarrhea, unspecified: Secondary | ICD-10-CM | POA: Insufficient documentation

## 2021-05-26 DIAGNOSIS — Z20822 Contact with and (suspected) exposure to covid-19: Secondary | ICD-10-CM | POA: Insufficient documentation

## 2021-05-26 LAB — COMPREHENSIVE METABOLIC PANEL
ALT: 16 U/L (ref 0–44)
AST: 22 U/L (ref 15–41)
Albumin: 3.4 g/dL — ABNORMAL LOW (ref 3.5–5.0)
Alkaline Phosphatase: 65 U/L (ref 38–126)
Anion gap: 8 (ref 5–15)
BUN: 10 mg/dL (ref 6–20)
CO2: 23 mmol/L (ref 22–32)
Calcium: 8.8 mg/dL — ABNORMAL LOW (ref 8.9–10.3)
Chloride: 103 mmol/L (ref 98–111)
Creatinine, Ser: 0.97 mg/dL (ref 0.61–1.24)
GFR, Estimated: >60 mL/min (ref >60)
Glucose, Bld: 112 mg/dL — ABNORMAL HIGH (ref 70–99)
Potassium: 4.2 mmol/L (ref 3.5–5.1)
Sodium: 134 mmol/L — ABNORMAL LOW (ref 135–145)
Total Bilirubin: 0.7 mg/dL (ref 0.3–1.2)
Total Protein: 7.6 g/dL (ref 6.5–8.1)

## 2021-05-26 LAB — RESP PANEL BY RT-PCR (FLU A&B, COVID) ARPGX2
Influenza A by PCR: NEGATIVE
Influenza B by PCR: NEGATIVE
SARS Coronavirus 2 by RT PCR: NEGATIVE

## 2021-05-26 LAB — CBC WITH DIFFERENTIAL/PLATELET
Abs Immature Granulocytes: 0.08 10*3/uL — ABNORMAL HIGH (ref 0.00–0.07)
Basophils Absolute: 0 10*3/uL (ref 0.0–0.1)
Basophils Relative: 0 %
Eosinophils Absolute: 0 10*3/uL (ref 0.0–0.5)
Eosinophils Relative: 0 %
HCT: 43.2 % (ref 39.0–52.0)
Hemoglobin: 14 g/dL (ref 13.0–17.0)
Immature Granulocytes: 1 %
Lymphocytes Relative: 13 %
Lymphs Abs: 1.4 10*3/uL (ref 0.7–4.0)
MCH: 32.2 pg (ref 26.0–34.0)
MCHC: 32.4 g/dL (ref 30.0–36.0)
MCV: 99.3 fL (ref 80.0–100.0)
Monocytes Absolute: 0.6 10*3/uL (ref 0.1–1.0)
Monocytes Relative: 6 %
Neutro Abs: 8.5 10*3/uL — ABNORMAL HIGH (ref 1.7–7.7)
Neutrophils Relative %: 80 %
Platelets: 195 10*3/uL (ref 150–400)
RBC: 4.35 MIL/uL (ref 4.22–5.81)
RDW: 13.8 % (ref 11.5–15.5)
WBC: 10.6 10*3/uL — ABNORMAL HIGH (ref 4.0–10.5)
nRBC: 0 % (ref 0.0–0.2)

## 2021-05-26 LAB — LACTIC ACID, PLASMA: Lactic Acid, Venous: 1.8 mmol/L (ref 0.5–1.9)

## 2021-05-26 MED ORDER — AZITHROMYCIN 250 MG PO TABS: ORAL_TABLET | ORAL | 0 refills | Status: DC

## 2021-05-26 MED ORDER — CEFTRIAXONE SODIUM 1 G IJ SOLR
1.0000 g | Freq: Once | INTRAMUSCULAR | Status: DC
  Filled 2021-05-26: qty 10

## 2021-05-26 MED ORDER — IPRATROPIUM-ALBUTEROL 0.5-2.5 (3) MG/3ML IN SOLN
3.0000 mL | Freq: Once | RESPIRATORY_TRACT | Status: AC
Start: 2021-05-26 — End: 2021-05-26
  Administered 2021-05-26: 3 mL via RESPIRATORY_TRACT
  Filled 2021-05-26: qty 3

## 2021-05-26 MED ORDER — SODIUM CHLORIDE 0.9 % IV SOLN
1.0000 g | Freq: Once | INTRAVENOUS | Status: AC
Start: 2021-05-26 — End: 2021-05-26
  Administered 2021-05-26: 1 g via INTRAVENOUS
  Filled 2021-05-26: qty 10

## 2021-05-26 MED ORDER — PSEUDOEPH-BROMPHEN-DM 30-2-10 MG/5ML PO SYRP: 10.0000 mL | ORAL_SOLUTION | Freq: Four times a day (QID) | ORAL | 0 refills | Status: DC | PRN

## 2021-05-26 MED ORDER — BENZONATATE 100 MG PO CAPS: 100.0000 mg | ORAL_CAPSULE | Freq: Three times a day (TID) | ORAL | 0 refills | Status: AC | PRN

## 2021-05-26 MED ORDER — SODIUM CHLORIDE 0.9 % IV BOLUS
1000.0000 mL | Freq: Once | INTRAVENOUS | Status: AC
  Administered 2021-05-26: 1000 mL via INTRAVENOUS

## 2021-05-26 NOTE — ED Provider Notes (Signed)
[facility] Provider Note  Patient Contact: 12:13 PM (approximate)   History   Shortness of Breath   HPI  Carlos Maddox is a 51 y.o. male who presents the emergency department complaining of shortness of breath, cough, weakness, diarrhea.  Patient states that symptoms began 2 days ago.  Patient does have a history of COPD and has been using his inhalers with no improvement of his respiratory symptoms.  Patient is homeless.  He denies any headache, visual changes, neck pain or stiffness, chest pain, abdominal pain.  He is having diarrhea.  No urinary symptoms reported.     Physical Exam   Triage Vital Signs: ED Triage Vitals  Enc Vitals Group     BP 05/26/21 1120 137/70     Pulse Rate 05/26/21 1120 99     Resp 05/26/21 1120 (!) 22     Temp 05/26/21 1120 99.3 F (37.4 C)     Temp Source 05/26/21 1120 Oral     SpO2 05/26/21 1120 96 %     Weight 05/26/21 1109 140 lb (63.5 kg)     Height 05/26/21 1109 6\' 1"  (1.854 m)     Head Circumference --      Peak Flow --      Pain Score 05/26/21 1109 10     Pain Loc --      Pain Edu? --      Excl. in [place]? --     Most recent vital signs: Vitals:   05/26/21 1330 05/26/21 1400  BP: 128/74 129/70  Pulse: 87 90  Resp: (!) 25   Temp:    SpO2: 99% 98%     General: Alert and in no acute distress. ENT:      Ears:       Nose: No congestion/rhinnorhea.      Mouth/Throat: Mucous membranes are slightly dry.  No gross oropharyngeal erythema or edema. Neck: No stridor. No cervical spine tenderness to palpation.  Cardiovascular:  Good peripheral perfusion Respiratory: Normal respiratory effort without tachypnea or retractions. Lungs with some slight rales in the right lung field, wheezing noted in bilateral lung fields.  Expiratory wheezing only.Kermit Balo air entry to the bases with no decreased or absent breath sounds. Gastrointestinal: Bowel sounds 4 quadrants. Soft and nontender to palpation. No guarding or  rigidity. No palpable masses. No distention. No CVA tenderness. Musculoskeletal: Full range of motion to all extremities.  Neurologic:  No gross focal neurologic deficits are appreciated.  Skin:   No rash noted Other:   ED Results / Procedures / Treatments   Labs (all labs ordered are listed, but only abnormal results are displayed) Labs Reviewed  COMPREHENSIVE METABOLIC PANEL - Abnormal; Notable for the following components:      Result Value   Sodium 134 (*)    Glucose, Bld 112 (*)    Calcium 8.8 (*)    Albumin 3.4 (*)    All other components within normal limits  CBC WITH DIFFERENTIAL/PLATELET - Abnormal; Notable for the following components:   WBC 10.6 (*)    Neutro Abs 8.5 (*)    Abs Immature Granulocytes 0.08 (*)    All other components within normal limits  RESP PANEL BY RT-PCR (FLU A&B, COVID) [id number]  CULTURE, BLOOD (ROUTINE X 2)  CULTURE, BLOOD (ROUTINE X 2)  LACTIC ACID, PLASMA     EKG     RADIOLOGY  I personally viewed and evaluated these images as part of my medical decision making,  as well as reviewing the written report by the radiologist.  ED Provider Interpretation: Patchy opacity in the right middle lung region, likely consistent with pneumonia  DG Chest 2 View  Result Date: 05/26/2021 CLINICAL DATA:  Shortness of breath and diffuse pain. History of COPD. EXAM: CHEST - 2 VIEW COMPARISON:  05/27/2018 FINDINGS: Normal sized heart. Interval minimal patchy opacity in the right mid upper lung zone in the frontal projection, not seen in the lateral projection. Stable hyperexpansion of the lungs with mild prominence of the interstitial markings. Unremarkable bones. IMPRESSION: 1. Interval minimal patchy density in the right mid upper lung zone laterally. If the patient has any symptoms of pneumonia, follow-up PA and lateral chest radiographs would be recommended in 3-4 weeks following trial of antibiotic therapy to ensure resolution and exclude underlying  malignancy. If the patient does not have any symptoms of pneumonia, a chest CT with contrast would better defined this opacity. 2. Stable mild changes of COPD. Electronically Signed   By: Beckie Salts M.D.   On: 05/26/2021 11:43    PROCEDURES:  Critical Care performed: No  Procedures   MEDICATIONS ORDERED IN ED: Medications  ipratropium-albuterol (DUONEB) 0.5-2.5 (3) MG/3ML nebulizer solution 3 mL (3 mLs Nebulization Given 05/26/21 1252)  sodium chloride 0.9 % bolus 1,000 mL (0 mLs Intravenous Stopped 05/26/21 1407)  cefTRIAXone (ROCEPHIN) 1 g in sodium chloride 0.9 % 100 mL IVPB (0 g Intravenous Stopped 05/26/21 1407)     IMPRESSION / MDM / ASSESSMENT AND PLAN / ED COURSE  I reviewed the triage vital signs and the nursing notes.                              Differential diagnosis includes, but is not limited to, COVID, influenza, COPD exacerbation, pneumonia   Patient's diagnosis is consistent with pneumonia.  Patient presented to the emergency department increased cough.  Patient did have some wheezing on physical exam as well as some mild rales in his right middle lobe.  Wheezing improved after DuoNeb treatment.  Patient did have findings on chest x-ray consistent with pneumonia.  Labs with a very slight increase in his white blood cell count at 10.6 but lactic is reassuring.  Blood cultures were ordered but obviously they have not returned while he is in the emergency department.  At this time patient is making obtaining good O2 saturation without oxygen, vitals are otherwise reassuring at this time.  Patient has received Rocephin, fluids here in the ED and will be discharged with azithromycin, cough medications.  Patient is homeless which complicates his treatment.given the fact the patient is homeless, arrived borderline febrile and tachypneic, I did consider admission.  However he does not require admission at this time.  Labs, vital signs, chest x-ray are generally reassuring.  And patient  is able to receive his prescriptions at this time.  Return precautions discussed with the patient.  Follow-up primary care as needed..  Patient is given ED precautions to return to the ED for any worsening or new symptoms.        FINAL CLINICAL IMPRESSION(S) / ED DIAGNOSES   Final diagnoses:  Community acquired pneumonia of right middle lobe of lung     Rx / DC Orders   ED Discharge Orders     None        Note:  This document was prepared using Dragon voice recognition software and may include unintentional dictation errors.  Brynda Peon 05/26/21 1410    Rada Hay, MD 05/26/21 1539

## 2021-05-26 NOTE — ED Notes (Signed)
Pt refused to sign topaz pad or take printed prescriptions and discharge paperwork with him. Pt informed that he would need these prescriptions to feel better and pt stated "throw that shit away". Pt then ambulated to lobby with no paperwork, EDP made aware.

## 2021-05-26 NOTE — ED Triage Notes (Signed)
Pt via POV from home. Pt c/o SOB and pain all over since Friday. Pt has hx of COPD. Pt is A&OX4 and but tachypneic on arrival.

## 2021-05-31 LAB — CULTURE, BLOOD (ROUTINE X 2)
Culture: NO GROWTH
Culture: NO GROWTH
Special Requests: ADEQUATE

## 2021-06-24 ENCOUNTER — Encounter (HOSPITAL_COMMUNITY): Payer: Self-pay | Admitting: Radiology

## 2022-08-21 IMAGING — CR DG CHEST 2V
1 series · 3 of 3 positions shown · non-contrast
Comparison: 05/27/2018

CLINICAL DATA: Shortness of breath and diffuse pain. History of
COPD.

EXAM:
CHEST - 2 VIEW

[Series 1: dg chest 2 view · 0.14mm/px · 3 of 3 slices shown]
[im 1/3]
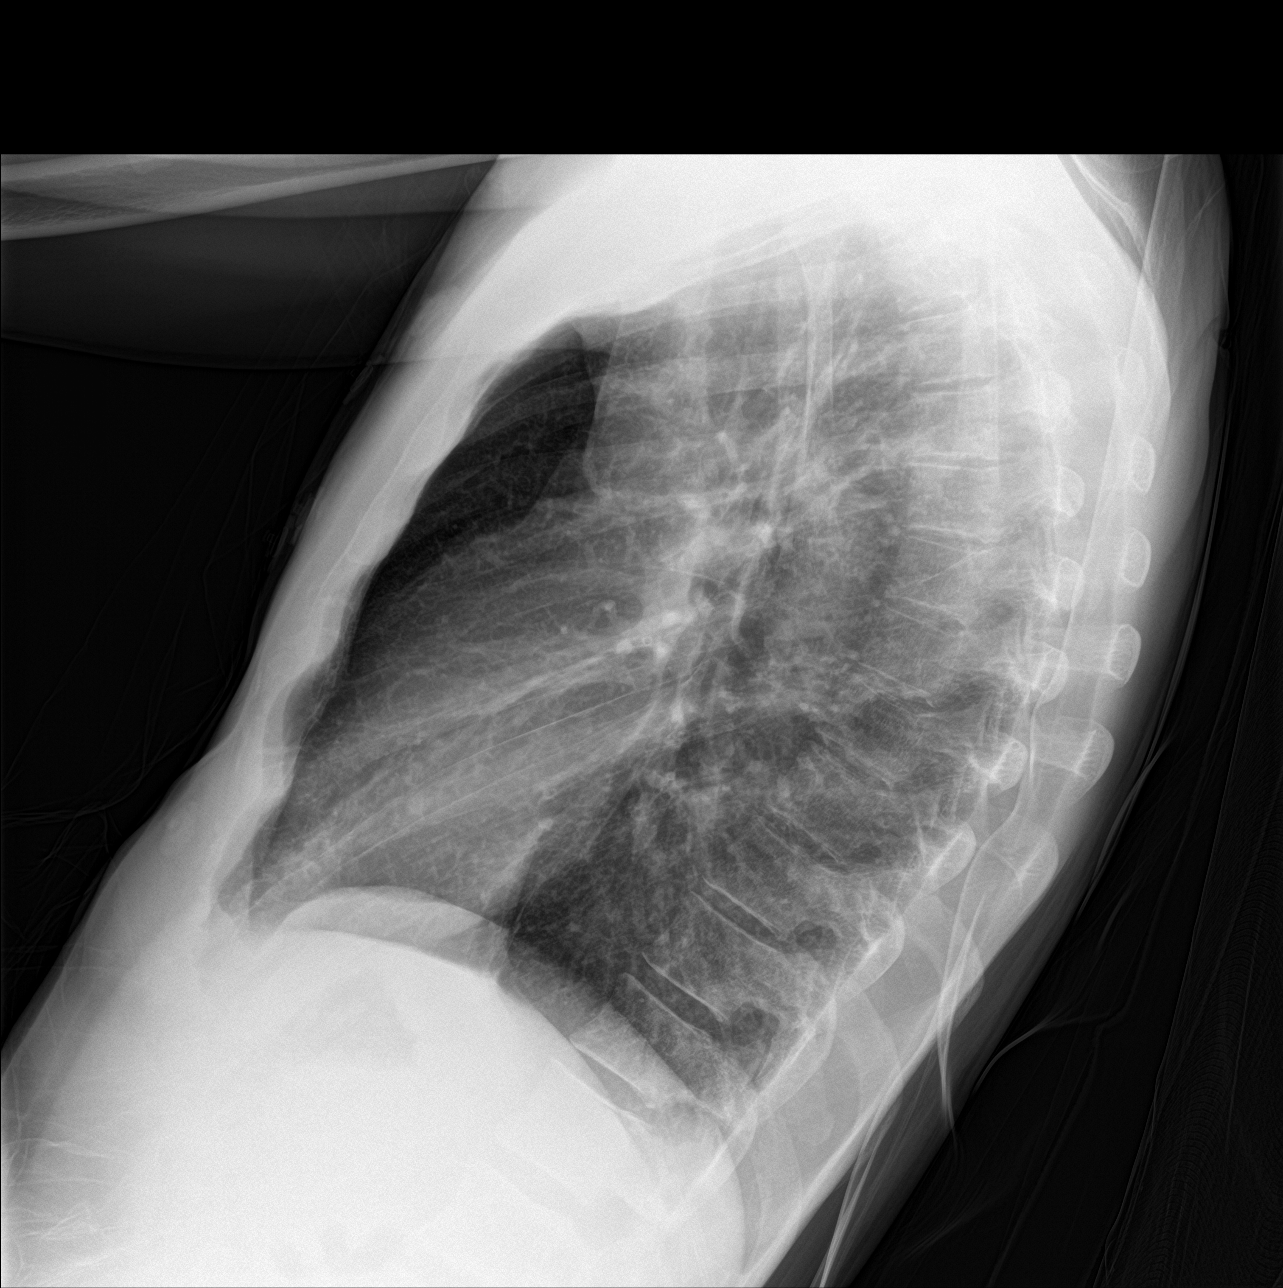
[im 2/3]
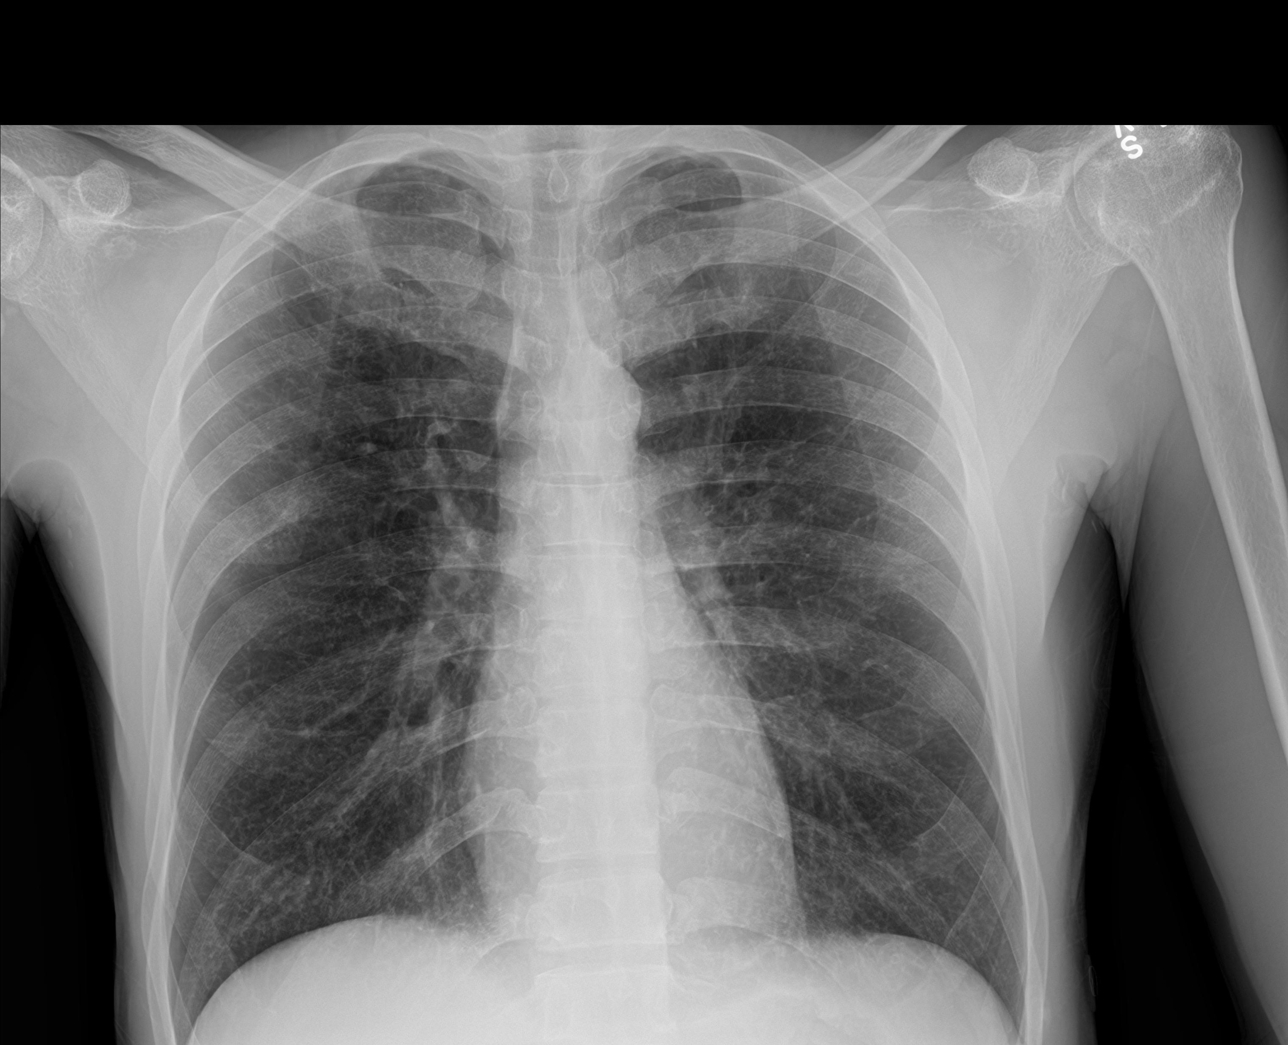
[im 3/3]
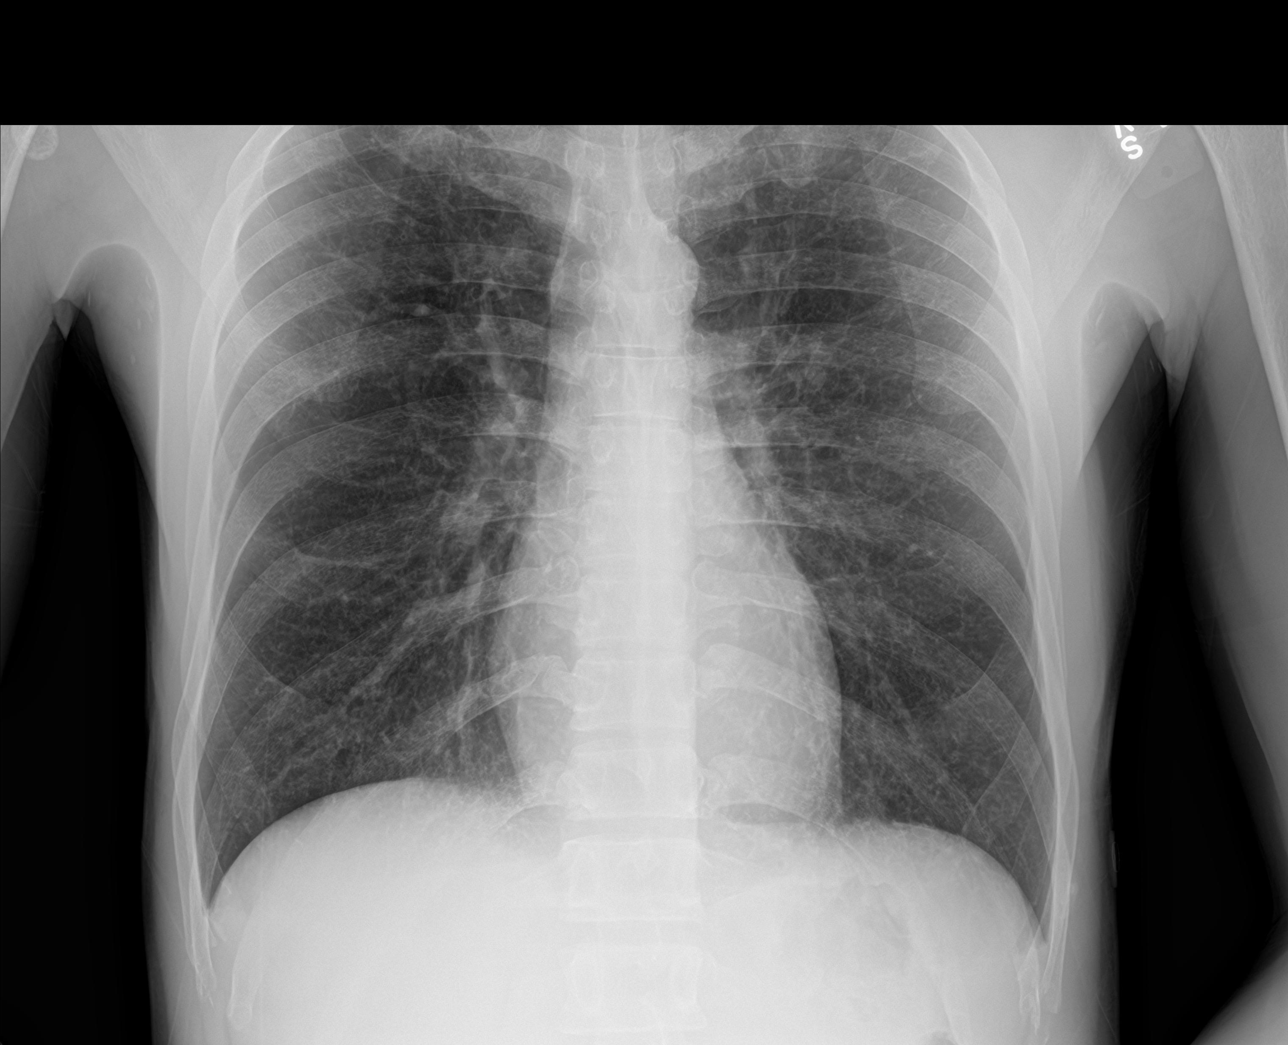

[3 of 3 positions shown; findings below may reference images not displayed]

FINDINGS: Normal sized heart. Interval minimal patchy opacity in the right mid
upper lung zone in the frontal projection, not seen in the lateral
projection. Stable hyperexpansion of the lungs with mild prominence
of the interstitial markings. Unremarkable bones.
IMPRESSION: 1. Interval minimal patchy density in the right mid upper lung zone
laterally. If the patient has any symptoms of pneumonia, follow-up
PA and lateral chest radiographs would be recommended in 3-4 weeks
following trial of antibiotic therapy to ensure resolution and
exclude underlying malignancy. If the patient does not have any
symptoms of pneumonia, a chest CT with contrast would better defined
this opacity.
2. Stable mild changes of COPD.

## 2023-02-07 ENCOUNTER — Emergency Department: Payer: MEDICAID

## 2023-02-07 DIAGNOSIS — J069 Acute upper respiratory infection, unspecified: Secondary | ICD-10-CM | POA: Insufficient documentation

## 2023-02-07 DIAGNOSIS — J441 Chronic obstructive pulmonary disease with (acute) exacerbation: Secondary | ICD-10-CM | POA: Diagnosis not present

## 2023-02-07 DIAGNOSIS — Z8616 Personal history of COVID-19: Secondary | ICD-10-CM | POA: Diagnosis not present

## 2023-02-07 DIAGNOSIS — J45909 Unspecified asthma, uncomplicated: Secondary | ICD-10-CM | POA: Diagnosis not present

## 2023-02-07 DIAGNOSIS — B9789 Other viral agents as the cause of diseases classified elsewhere: Secondary | ICD-10-CM | POA: Insufficient documentation

## 2023-02-07 DIAGNOSIS — Z20822 Contact with and (suspected) exposure to covid-19: Secondary | ICD-10-CM | POA: Diagnosis not present

## 2023-02-07 DIAGNOSIS — R0602 Shortness of breath: Secondary | ICD-10-CM | POA: Diagnosis present

## 2023-02-07 DIAGNOSIS — Z96649 Presence of unspecified artificial hip joint: Secondary | ICD-10-CM | POA: Diagnosis not present

## 2023-02-07 DIAGNOSIS — Z720 Tobacco use: Secondary | ICD-10-CM | POA: Insufficient documentation

## 2023-02-07 LAB — BASIC METABOLIC PANEL
Anion gap: 11 (ref 5–15)
BUN: 22 mg/dL — ABNORMAL HIGH (ref 6–20)
CO2: 24 mmol/L (ref 22–32)
Calcium: 9.3 mg/dL (ref 8.9–10.3)
Chloride: 100 mmol/L (ref 98–111)
Creatinine, Ser: 1.2 mg/dL (ref 0.61–1.24)
GFR, Estimated: >60 mL/min (ref >60)
Glucose, Bld: 104 mg/dL — ABNORMAL HIGH (ref 70–99)
Potassium: 3.8 mmol/L (ref 3.5–5.1)
Sodium: 135 mmol/L (ref 135–145)

## 2023-02-07 LAB — RESP PANEL BY RT-PCR (RSV, FLU A&B, COVID)  RVPGX2
Influenza A by PCR: NEGATIVE
Influenza B by PCR: NEGATIVE
Resp Syncytial Virus by PCR: NEGATIVE
SARS Coronavirus 2 by RT PCR: NEGATIVE

## 2023-02-07 LAB — CBC
HCT: 38.9 % — ABNORMAL LOW (ref 39.0–52.0)
Hemoglobin: 12.8 g/dL — ABNORMAL LOW (ref 13.0–17.0)
MCH: 31.3 pg (ref 26.0–34.0)
MCHC: 32.9 g/dL (ref 30.0–36.0)
MCV: 95.1 fL (ref 80.0–100.0)
Platelets: 233 10*3/uL (ref 150–400)
RBC: 4.09 MIL/uL — ABNORMAL LOW (ref 4.22–5.81)
RDW: 12.8 % (ref 11.5–15.5)
WBC: 7.6 10*3/uL (ref 4.0–10.5)
nRBC: 0.3 % — ABNORMAL HIGH (ref 0.0–0.2)

## 2023-02-07 LAB — TROPONIN I (HIGH SENSITIVITY): Troponin I (High Sensitivity): 5 ng/L (ref <18)

## 2023-02-07 MED ORDER — ALBUTEROL SULFATE (2.5 MG/3ML) 0.083% IN NEBU
5.0000 mg | INHALATION_SOLUTION | Freq: Once | RESPIRATORY_TRACT | Status: AC
  Administered 2023-02-07: 5 mg via RESPIRATORY_TRACT
  Filled 2023-02-07: qty 6

## 2023-02-07 NOTE — ED Triage Notes (Signed)
Patient is here tonight with complaints of shortness of breath (history of COPD); He states that he starting having shortness of breath after he smoked a cigarette

## 2023-02-08 ENCOUNTER — Emergency Department
Admission: EM | Admit: 2023-02-08 | Discharge: 2023-02-08 | Disposition: A | Discharge status: Home / Self Care | Payer: MEDICAID | Attending: Emergency Medicine | Admitting: Emergency Medicine

## 2023-02-08 DIAGNOSIS — J441 Chronic obstructive pulmonary disease with (acute) exacerbation: Secondary | ICD-10-CM

## 2023-02-08 DIAGNOSIS — J069 Acute upper respiratory infection, unspecified: Secondary | ICD-10-CM

## 2023-02-08 LAB — TROPONIN I (HIGH SENSITIVITY): Troponin I (High Sensitivity): 5 ng/L (ref <18)

## 2023-02-08 MED ORDER — ALBUTEROL SULFATE HFA 108 (90 BASE) MCG/ACT IN AERS: 2.0000 | INHALATION_SPRAY | RESPIRATORY_TRACT | 0 refills | Status: AC | PRN

## 2023-02-08 MED ORDER — PREDNISONE 20 MG PO TABS
60.0000 mg | ORAL_TABLET | Freq: Once | ORAL | Status: AC
  Administered 2023-02-08: 60 mg via ORAL
  Filled 2023-02-08: qty 3

## 2023-02-08 MED ORDER — IPRATROPIUM-ALBUTEROL 0.5-2.5 (3) MG/3ML IN SOLN
3.0000 mL | Freq: Once | RESPIRATORY_TRACT | Status: AC
  Administered 2023-02-08: 3 mL via RESPIRATORY_TRACT
  Filled 2023-02-08: qty 3

## 2023-02-08 MED ORDER — PREDNISONE 20 MG PO TABS: 60.0000 mg | ORAL_TABLET | Freq: Every day | ORAL | 0 refills | Status: AC

## 2023-02-08 NOTE — Discharge Instructions (Addendum)
You may alternate Tylenol 1000 mg every 6 hours as needed for pain, fever and Ibuprofen 800 mg every 6-8 hours as needed for pain, fever.  Please take Ibuprofen with food.  Do not take more than 4000 mg of Tylenol (acetaminophen) in a 24 hour period. ° °

## 2023-02-08 NOTE — ED Provider Notes (Signed)
[facility] Provider Note    Event Date/Time   First MD Initiated Contact with Patient 02/08/23 0214     (approximate)   History   Shortness of Breath   HPI  Carlos Maddox is a 52 y.o. male with history of asthma, COPD, tobacco use who presents to the emergency department with complaints of cough, congestion, body aches, wheezing.  No chest pain.  No vomiting or diarrhea.   History provided by patient.    Past Medical History:  Diagnosis Date   Asthma    COVID-19 07/10/2019   rapid antigen test at [facility] ED on 07/10/2019    Past Surgical History:  Procedure Laterality Date   PARTIAL HIP ARTHROPLASTY      MEDICATIONS:  Prior to Admission medications   Medication Sig Start Date End Date Taking? Authorizing Provider  albuterol (PROVENTIL HFA;VENTOLIN HFA) 108 (90 Base) MCG/ACT inhaler Inhale 2 puffs into the lungs every 6 (six) hours as needed for wheezing or shortness of breath. 05/29/18   Auburn Bilberry, MD  azithromycin (ZITHROMAX Z-PAK) 250 MG tablet Take 2 tablets (500 mg) on  Day 1,  followed by 1 tablet (250 mg) once daily on Days 2 through 5. 05/26/21   Cuthriell, Delorise Royals, PA-C  brompheniramine-pseudoephedrine-DM 30-2-10 MG/5ML syrup Take 10 mLs by mouth 4 (four) times daily as needed. 05/26/21   Cuthriell, Delorise Royals, PA-C  Fluticasone-Salmeterol (ADVAIR DISKUS) 100-50 MCG/DOSE AEPB Inhale 1 puff into the lungs 2 (two) times daily. 05/29/18 05/29/19  Auburn Bilberry, MD  ondansetron (ZOFRAN ODT) 4 MG disintegrating tablet Allow 1-2 tablets to dissolve in your mouth every 8 hours as needed for nausea/vomiting 07/10/19   Loleta Rose, MD    Physical Exam   Triage Vital Signs: ED Triage Vitals  Encounter Vitals Group     BP 02/07/23 2248 132/77     Systolic BP Percentile --      Diastolic BP Percentile --      Pulse Rate 02/07/23 2248 79     Resp 02/07/23 2248 20     Temp 02/07/23 2248 98.3 F (36.8 C)     Temp src --      SpO2 02/07/23  2248 94 %     Weight 02/07/23 2248 140 lb (63.5 kg)     Height 02/07/23 2248 6\' 1"  (1.854 m)     Head Circumference --      Peak Flow --      Pain Score 02/07/23 2249 8     Pain Loc --      Pain Education --      Exclude from Growth Chart --     Most recent vital signs: Vitals:   02/07/23 2248 02/08/23 0209  BP: 132/77 107/61  Pulse: 79 (!) 55  Resp: 20 17  Temp: 98.3 F (36.8 C) 97.8 F (36.6 C)  SpO2: 94% 97%    CONSTITUTIONAL: Alert, responds appropriately to questions. Well-appearing; well-nourished HEAD: Normocephalic, atraumatic EYES: Conjunctivae clear, pupils appear equal, sclera nonicteric ENT: normal nose; moist mucous membranes NECK: Supple, normal ROM CARD: RRR; S1 and S2 appreciated RESP: Normal chest excursion without splinting or tachypnea; slight expiratory wheeze, no rhonchi, no rales, no hypoxia or respiratory distress, speaking full sentences ABD/GI: Non-distended; soft, non-tender, no rebound, no guarding, no peritoneal signs BACK: The back appears normal EXT: Normal ROM in all joints; no deformity noted, no edema SKIN: Normal color for age and race; warm; no rash on exposed skin NEURO: Moves all  extremities equally, normal speech PSYCH: The patient's mood and manner are appropriate.   ED Results / Procedures / Treatments   LABS: (all labs ordered are listed, but only abnormal results are displayed) Labs Reviewed  CBC - Abnormal; Notable for the following components:      Result Value   RBC 4.09 (*)    Hemoglobin 12.8 (*)    HCT 38.9 (*)    nRBC 0.3 (*)    All other components within normal limits  BASIC METABOLIC PANEL - Abnormal; Notable for the following components:   Glucose, Bld 104 (*)    BUN 22 (*)    All other components within normal limits  RESP PANEL BY RT-PCR (RSV, FLU A&B, COVID)  [id number]  TROPONIN I (HIGH SENSITIVITY)  TROPONIN I (HIGH SENSITIVITY)     EKG:   RADIOLOGY: My personal review and interpretation of imaging:  Chest x-ray clear.  I have personally reviewed all radiology reports.   DG Chest 2 View  Result Date: 02/08/2023 CLINICAL DATA:  Shortness of breath EXAM: CHEST - 2 VIEW COMPARISON:  05/26/2021 FINDINGS: Cardiac shadow is within normal limits. Lungs are well aerated bilaterally. No focal infiltrate or sizable effusion is noted. No bony abnormality is noted. IMPRESSION: No acute abnormality noted. Electronically Signed   By: Alcide Clever M.D.   On: 02/08/2023 00:15     PROCEDURES:  Critical Care performed: No   CRITICAL CARE Performed by: Baxter Hire Shelbia Scinto   Total critical care time: 0 minutes  Critical care time was exclusive of separately billable procedures and treating other patients.  Critical care was necessary to treat or prevent imminent or life-threatening deterioration.  Critical care was time spent personally by me on the following activities: development of treatment plan with patient and/or surrogate as well as nursing, discussions with consultants, evaluation of patient's response to treatment, examination of patient, obtaining history from patient or surrogate, ordering and performing treatments and interventions, ordering and review of laboratory studies, ordering and review of radiographic studies, pulse oximetry and re-evaluation of patient's condition.   Procedures    IMPRESSION / MDM / ASSESSMENT AND PLAN / ED COURSE  I reviewed the triage vital signs and the nursing notes.    Patient here with symptoms of viral URI and wheezing.    DIFFERENTIAL DIAGNOSIS (includes but not limited to):   Viral URI, pneumonia, COPD exacerbation, doubt ACS, CHF, PE   Patient's presentation is most consistent with acute presentation with potential threat to life or bodily function.   PLAN: Labs obtained from triage show no leukocytosis.  Normal electrolytes.  Troponin negative.  COVID, flu and RSV negative.  Chest x-ray reviewed and interpreted by myself and the radiologist.   Will give breathing treatment here, prednisone.   MEDICATIONS GIVEN IN ED: Medications  albuterol (PROVENTIL) (2.5 MG/3ML) 0.083% nebulizer solution 5 mg (5 mg Nebulization Given 02/07/23 2300)  ipratropium-albuterol (DUONEB) 0.5-2.5 (3) MG/3ML nebulizer solution 3 mL (3 mLs Nebulization Given 02/08/23 0307)  predniSONE (DELTASONE) tablet 60 mg (60 mg Oral Given 02/08/23 0306)     ED COURSE: Second troponin negative.  I feel he is safe for discharge.  Will provide prednisone burst and albuterol inhaler prescriptions.  No indication for antibiotics today.   At this time, I do not feel there is any life-threatening condition present. I reviewed all nursing notes, vitals, pertinent previous records.  All lab and urine results, EKGs, imaging ordered have been independently reviewed and interpreted by myself.  I reviewed all  available radiology reports from any imaging ordered this visit.  Based on my assessment, I feel the patient is safe to be discharged home without further emergent workup and can continue workup as an outpatient as needed. Discussed all findings, treatment plan as well as usual and customary return precautions.  They verbalize understanding and are comfortable with this plan.  Outpatient follow-up has been provided as needed.  All questions have been answered.    CONSULTS:  none   OUTSIDE RECORDS REVIEWED: Reviewed admission in 2019 for COPD.       FINAL CLINICAL IMPRESSION(S) / ED DIAGNOSES   Final diagnoses:  Viral URI with cough  COPD exacerbation (HCC)     Rx / DC Orders   ED Discharge Orders          Ordered    albuterol (VENTOLIN HFA) 108 (90 Base) MCG/ACT inhaler  Every 4 hours PRN        02/08/23 0235    predniSONE (DELTASONE) 20 MG tablet  Daily        02/08/23 0235             Note:  This document was prepared using Dragon voice recognition software and may include unintentional dictation errors.   Esgar Barnick, Layla Maw, DO 02/08/23 [phone]
